# Patient Record
Sex: Male | Born: 1970 | Race: Black or African American | Hispanic: No | Marital: Married | Smoking: Never smoker
Health system: Southern US, Community
[De-identification: ages and names within clinical notes are randomized; demographics above are authoritative.]

---

## 2015-07-16 ENCOUNTER — Encounter: Payer: Self-pay | Admitting: Podiatry

## 2015-07-16 ENCOUNTER — Ambulatory Visit (INDEPENDENT_AMBULATORY_CARE_PROVIDER_SITE_OTHER): Payer: PRIVATE HEALTH INSURANCE | Admitting: Podiatry

## 2015-07-16 ENCOUNTER — Ambulatory Visit (INDEPENDENT_AMBULATORY_CARE_PROVIDER_SITE_OTHER): Payer: PRIVATE HEALTH INSURANCE

## 2015-07-16 VITALS — BP 127/84 | HR 77 | Resp 16

## 2015-07-16 DIAGNOSIS — M722 Plantar fascial fibromatosis: Secondary | ICD-10-CM

## 2015-07-16 NOTE — Progress Notes (Signed)
   Subjective:    Patient ID: Alexander Trujillo, male    DOB: 04/24/1971, 44 y.o.   MRN: [id number]  HPI  Pt presents with pain in left heel, has tried icing, stretching nsaids, pf brace and night splints with no relief  Review of Systems  All other systems reviewed and are negative.      Objective:   Physical Exam        Assessment & Plan:

## 2015-07-18 ENCOUNTER — Ambulatory Visit: Payer: PRIVATE HEALTH INSURANCE | Admitting: Podiatry

## 2015-07-18 NOTE — Progress Notes (Signed)
Subjective:     Patient ID: Alexander Trujillo, male   DOB: 10/07/1970, 44 y.o.   MRN: [id number]  HPI patient points plantar aspect left heel states it's really been bothering him and hurting for about 3 months. Has tried night splints and plantar fascial bracing with no relief   Review of Systems  All other systems reviewed and are negative.      Objective:   Physical Exam  Constitutional: He is oriented to person, place, and time.  Cardiovascular: Intact distal pulses.   Musculoskeletal: Normal range of motion.  Neurological: He is oriented to person, place, and time.  Skin: Skin is warm.  Nursing note and vitals reviewed.  neurovascular status intact muscle strength adequate range of motion within normal limits with patient noted to have exquisite discomfort plantar aspect left heel insertional point tendon the calcaneus with fluid buildup and moderate depression of the arch. Patient's noted to have good digital perfusion and is well oriented 3     Assessment:     Plantar fasciitis left center of the heel    Plan:     Shockwave initiated is patient refuses injection and this will be accomplished over the next few weeks

## 2017-06-16 ENCOUNTER — Ambulatory Visit (INDEPENDENT_AMBULATORY_CARE_PROVIDER_SITE_OTHER): Payer: BLUE CROSS/BLUE SHIELD | Admitting: Physician Assistant

## 2017-06-16 ENCOUNTER — Encounter: Payer: Self-pay | Admitting: Physician Assistant

## 2017-06-16 VITALS — BP 150/110 | HR 88 | Ht 72.0 in | Wt 238.0 lb

## 2017-06-16 DIAGNOSIS — R030 Elevated blood-pressure reading, without diagnosis of hypertension: Secondary | ICD-10-CM | POA: Diagnosis not present

## 2017-06-16 DIAGNOSIS — R1084 Generalized abdominal pain: Secondary | ICD-10-CM | POA: Diagnosis not present

## 2017-06-16 DIAGNOSIS — R1010 Upper abdominal pain, unspecified: Secondary | ICD-10-CM

## 2017-06-16 NOTE — Patient Instructions (Addendum)
140/90   Will call with CT.   DASH Eating Plan DASH stands for "Dietary Approaches to Stop Hypertension." The DASH eating plan is a healthy eating plan that has been shown to reduce high blood pressure (hypertension). It may also reduce your risk for type 2 diabetes, heart disease, and stroke. The DASH eating plan may also help with weight loss. What are tips for following this plan? General guidelines  Avoid eating more than 2,300 mg (milligrams) of salt (sodium) a day. If you have hypertension, you may need to reduce your sodium intake to 1,500 mg a day.  Limit alcohol intake to no more than 1 drink a day for nonpregnant women and 2 drinks a day for men. One drink equals 12 oz of beer, 5 oz of wine, or 1 oz of hard liquor.  Work with your health care provider to maintain a healthy body weight or to lose weight. Ask what an ideal weight is for you.  Get at least 30 minutes of exercise that causes your heart to beat faster (aerobic exercise) most days of the week. Activities may include walking, swimming, or biking.  Work with your health care provider or diet and nutrition specialist (dietitian) to adjust your eating plan to your individual calorie needs. Reading food labels  Check food labels for the amount of sodium per serving. Choose foods with less than 5 percent of the Daily Value of sodium. Generally, foods with less than 300 mg of sodium per serving fit into this eating plan.  To find whole grains, look for the word "whole" as the first word in the ingredient list. Shopping  Buy products labeled as "low-sodium" or "no salt added."  Buy fresh foods. Avoid canned foods and premade or frozen meals. Cooking  Avoid adding salt when cooking. Use salt-free seasonings or herbs instead of table salt or sea salt. Check with your health care provider or pharmacist before using salt substitutes.  Do not fry foods. Cook foods using healthy methods such as baking, boiling, grilling, and  broiling instead.  Cook with heart-healthy oils, such as olive, canola, soybean, or sunflower oil. Meal planning   Eat a balanced diet that includes: ? 5 or more servings of fruits and vegetables each day. At each meal, try to fill half of your plate with fruits and vegetables. ? Up to 6-8 servings of whole grains each day. ? Less than 6 oz of lean meat, poultry, or fish each day. A 3-oz serving of meat is about the same size as a deck of cards. One egg equals 1 oz. ? 2 servings of low-fat dairy each day. ? A serving of nuts, seeds, or beans 5 times each week. ? Heart-healthy fats. Healthy fats called Omega-3 fatty acids are found in foods such as flaxseeds and coldwater fish, like sardines, salmon, and mackerel.  Limit how much you eat of the following: ? Canned or prepackaged foods. ? Food that is high in trans fat, such as fried foods. ? Food that is high in saturated fat, such as fatty meat. ? Sweets, desserts, sugary drinks, and other foods with added sugar. ? Full-fat dairy products.  Do not salt foods before eating.  Try to eat at least 2 vegetarian meals each week.  Eat more home-cooked food and less restaurant, buffet, and fast food.  When eating at a restaurant, ask that your food be prepared with less salt or no salt, if possible. What foods are recommended? The items listed may not be  a complete list. Talk with your dietitian about what dietary choices are best for you. Grains Whole-grain or whole-wheat bread. Whole-grain or whole-wheat pasta. Brown rice. [place]. Bulgur. Whole-grain and low-sodium cereals. Pita bread. Low-fat, low-sodium crackers. Whole-wheat flour tortillas. Vegetables Fresh or frozen vegetables (raw, steamed, roasted, or grilled). Low-sodium or reduced-sodium tomato and vegetable juice. Low-sodium or reduced-sodium tomato sauce and tomato paste. Low-sodium or reduced-sodium canned vegetables. Fruits All fresh, dried, or frozen fruit. Canned  fruit in natural juice (without added sugar). Meat and other protein foods Skinless chicken or [place]. Ground chicken or [place]. Pork with fat trimmed off. Fish and seafood. Egg whites. Dried beans, peas, or lentils. Unsalted nuts, nut butters, and seeds. Unsalted canned beans. Lean cuts of beef with fat trimmed off. Low-sodium, lean deli meat. Dairy Low-fat (1%) or fat-free (skim) milk. Fat-free, low-fat, or reduced-fat cheeses. Nonfat, low-sodium ricotta or cottage cheese. Low-fat or nonfat yogurt. Low-fat, low-sodium cheese. Fats and oils Soft margarine without trans fats. Vegetable oil. Low-fat, reduced-fat, or light mayonnaise and salad dressings (reduced-sodium). Canola, safflower, olive, soybean, and sunflower oils. Avocado. Seasoning and other foods Herbs. Spices. Seasoning mixes without salt. Unsalted popcorn and pretzels. Fat-free sweets. What foods are not recommended? The items listed may not be a complete list. Talk with your dietitian about what dietary choices are best for you. Grains Baked goods made with fat, such as croissants, muffins, or some breads. Dry pasta or rice meal packs. Vegetables Creamed or fried vegetables. Vegetables in a cheese sauce. Regular canned vegetables (not low-sodium or reduced-sodium). Regular canned tomato sauce and paste (not low-sodium or reduced-sodium). Regular tomato and vegetable juice (not low-sodium or reduced-sodium). Angie Fava. Olives. Fruits Canned fruit in a light or heavy syrup. Fried fruit. Fruit in cream or butter sauce. Meat and other protein foods Fatty cuts of meat. Ribs. Fried meat. Berniece Salines. Sausage. Bologna and other processed lunch meats. Salami. Fatback. Hotdogs. Bratwurst. Salted nuts and seeds. Canned beans with added salt. Canned or smoked fish. Whole eggs or egg yolks. Chicken or [place] with skin. Dairy Whole or 2% milk, cream, and half-and-half. Whole or full-fat cream cheese. Whole-fat or sweetened yogurt. Full-fat cheese.  Nondairy creamers. Whipped toppings. Processed cheese and cheese spreads. Fats and oils Butter. Stick margarine. Lard. Shortening. Ghee. Bacon fat. Tropical oils, such as coconut, palm kernel, or palm oil. Seasoning and other foods Salted popcorn and pretzels. Onion salt, garlic salt, seasoned salt, table salt, and sea salt. Worcestershire sauce. Tartar sauce. Barbecue sauce. Teriyaki sauce. Soy sauce, including reduced-sodium. Steak sauce. Canned and packaged gravies. Fish sauce. Oyster sauce. Cocktail sauce. Horseradish that you find on the shelf. Ketchup. Mustard. Meat flavorings and tenderizers. Bouillon cubes. Hot sauce and Tabasco sauce. Premade or packaged marinades. Premade or packaged taco seasonings. Relishes. Regular salad dressings. Where to find more information:  National Heart, Lung, and [place]: https://wilson-eaton.com/  American Heart Association: www.heart.org Summary  The DASH eating plan is a healthy eating plan that has been shown to reduce high blood pressure (hypertension). It may also reduce your risk for type 2 diabetes, heart disease, and stroke.  With the DASH eating plan, you should limit salt (sodium) intake to 2,300 mg a day. If you have hypertension, you may need to reduce your sodium intake to 1,500 mg a day.  When on the DASH eating plan, aim to eat more fresh fruits and vegetables, whole grains, lean proteins, low-fat dairy, and heart-healthy fats.  Work with your health care provider or diet and nutrition specialist (  dietitian) to adjust your eating plan to your individual calorie needs. This information is not intended to replace advice given to you by your health care provider. Make sure you discuss any questions you have with your health care provider. Document Released: 08/28/2011 Document Revised: 09/01/2016 Document Reviewed: 09/01/2016 Elsevier Interactive Patient Education  2017 [employer].

## 2017-06-16 NOTE — Progress Notes (Signed)
   Subjective:    Patient ID: Alexander Trujillo, male    DOB: 09-02-1971, 46 y.o.   MRN: [id number]  HPI  Pt is a 46 yo male who presents to the clinic to establish care.   .. Active Ambulatory Problems    Diagnosis Date Noted  . Elevated blood pressure reading 06/16/2017  . Abdominal pain, generalized 06/16/2017   Resolved Ambulatory Problems    Diagnosis Date Noted  . No Resolved Ambulatory Problems   No Additional Past Medical History   .Marland Kitchen Family History  Problem Relation Age of Onset  . Hypertension Maternal Grandmother   . Cancer Maternal Grandfather        testicular   Pt comes in to discuss episodes of abdominal pain. First happened a couple of months ago while doing a sit up at the gym. He felt like he got a muscle spasm "charley horse" in his abdomen. Took about 20 minutes to resolve on it's on. His abdomen felt sore for a few days. 1 month ago he did same thing but tenderness has continued. Now he has ongoing abdomen soreness. Denies any n/v/d. Bowels have not changed. Pain is not based around food. Not tried anything to make better.    Review of Systems  All other systems reviewed and are negative.      Objective:   Physical Exam  Constitutional: He is oriented to person, place, and time. He appears well-developed and well-nourished.  HENT:  Head: Normocephalic and atraumatic.  Cardiovascular: Normal rate, regular rhythm and normal heart sounds.   Pulmonary/Chest: Effort normal and breath sounds normal.  Abdominal: Soft. Bowel sounds are normal. He exhibits no distension and no mass. There is tenderness. There is no rebound and no guarding.  Mild tenderness to palpation in epigastric area.  Negative murphys sign.  When asked to lift head while laying down seemed to have a mild bulge in the upper abdomen.   Neurological: He is alert and oriented to person, place, and time.  Psychiatric: He has a normal mood and affect. His behavior is normal.           Assessment & Plan:  Marland KitchenMarland KitchenHamlin was seen today for establish care and abdominal pain.  Diagnoses and all orders for this visit:  Abdominal pain, generalized -     COMPLETE METABOLIC PANEL WITH GFR -     Magnesium  Elevated blood pressure reading  Pain of upper abdomen -     US Abdomen Complete; Future   Suspect muscle cramps with or without hernia. Will get u/s.  Will get labs.   BP elevated discussed DASH diet. Recheck BP in 2 weeks.

## 2017-06-19 ENCOUNTER — Ambulatory Visit: Payer: BLUE CROSS/BLUE SHIELD

## 2017-06-26 ENCOUNTER — Other Ambulatory Visit: Payer: BLUE CROSS/BLUE SHIELD

## 2017-06-29 ENCOUNTER — Telehealth: Payer: Self-pay | Admitting: Physician Assistant

## 2017-06-29 DIAGNOSIS — R1010 Upper abdominal pain, unspecified: Secondary | ICD-10-CM

## 2017-06-29 DIAGNOSIS — R1011 Right upper quadrant pain: Secondary | ICD-10-CM

## 2017-06-29 NOTE — Telephone Encounter (Signed)
Per radiology order should be for CT instead of [place]. Routing for order.

## 2017-06-30 ENCOUNTER — Ambulatory Visit: Payer: BLUE CROSS/BLUE SHIELD | Admitting: Physician Assistant

## 2017-06-30 ENCOUNTER — Encounter: Payer: Self-pay | Admitting: Physician Assistant

## 2017-06-30 ENCOUNTER — Ambulatory Visit (INDEPENDENT_AMBULATORY_CARE_PROVIDER_SITE_OTHER): Payer: BLUE CROSS/BLUE SHIELD | Admitting: Physician Assistant

## 2017-06-30 VITALS — BP 138/80 | HR 89 | Ht 72.0 in | Wt 227.0 lb

## 2017-06-30 DIAGNOSIS — R030 Elevated blood-pressure reading, without diagnosis of hypertension: Secondary | ICD-10-CM

## 2017-06-30 DIAGNOSIS — I10 Essential (primary) hypertension: Secondary | ICD-10-CM | POA: Diagnosis not present

## 2017-06-30 DIAGNOSIS — R1084 Generalized abdominal pain: Secondary | ICD-10-CM

## 2017-06-30 MED ORDER — CHLORTHALIDONE 25 MG PO TABS: 12.5000 mg | ORAL_TABLET | Freq: Every day | ORAL | 5 refills | Status: DC

## 2017-06-30 NOTE — Telephone Encounter (Signed)
Ok to order CT of abdomen/pelvis w contrast. I tried to order Alexander Trujillo and then system alerted me to try u/s first so I thought it might be more affordable.

## 2017-06-30 NOTE — Progress Notes (Addendum)
   Subjective:    Patient ID: Alexander Trujillo, male    DOB: Feb 11, 1971, 46 y.o.   MRN: [id number]  HPI The patient is a pleasant 46 year old male who presents today for a two week BP check. He was seen on 9/25 and his BP reading was 150/110. He states that he has been checking his BP at home and it often ranges from 128-134/80-90. He has starting working out Monday through Friday daily. He is walking on the treadmill for cardio, but he states that he has avoided lifting or abdominal exercises given his upper abdominal pain he has experienced recently.  He has also limited his sodium intake. He is taking magnesium and garlic supplements daily for the last week. He also mentioned wanting to discuss use of a diuretic because he feels like he is retaining water.   Denies vision changes, headaches, bowel changes.   .. Active Ambulatory Problems    Diagnosis Date Noted  . Elevated blood pressure reading 06/16/2017  . Abdominal pain, generalized 06/16/2017   Resolved Ambulatory Problems    Diagnosis Date Noted  . No Resolved Ambulatory Problems   No Additional Past Medical History   Review of Systems  All other systems reviewed and are negative.      Objective:   Physical Exam  Constitutional: He is oriented to person, place, and time. He appears well-developed and well-nourished.  HENT:  Head: Normocephalic and atraumatic.  Cardiovascular: Normal rate, regular rhythm and normal heart sounds.   Pulmonary/Chest: Effort normal and breath sounds normal.  Neurological: He is alert and oriented to person, place, and time.  Skin: Skin is warm and dry.  Psychiatric: He has a normal mood and affect. His behavior is normal. Judgment and thought content normal.      Assessment & Plan:  Marland KitchenMarland KitchenDiagnoses and all orders for this visit:  Hypertension, unspecified type -     chlorthalidone (HYGROTON) 25 MG tablet; Take 0.5 tablets (12.5 mg total) by mouth daily.  Elevated blood pressure  reading  Abdominal pain, generalized  He is extremely motivated to control his elevated blood pressure through exercise and diet. Given his borderline BP readings, we will start chlorthalidone. He is going to continue checking his BP at home daily. Follow up in 6 months. Discussed side effects of medication. If he were to have muscle cramps or weakness please follow up.   Pt is to schedule his CT of abdomen to rule out hernia due to abdominal pain today.

## 2017-06-30 NOTE — Telephone Encounter (Signed)
CT ordered. 

## 2017-07-03 ENCOUNTER — Ambulatory Visit (INDEPENDENT_AMBULATORY_CARE_PROVIDER_SITE_OTHER): Payer: BLUE CROSS/BLUE SHIELD

## 2017-07-03 DIAGNOSIS — R1010 Upper abdominal pain, unspecified: Secondary | ICD-10-CM

## 2017-07-03 DIAGNOSIS — R109 Unspecified abdominal pain: Secondary | ICD-10-CM | POA: Diagnosis not present

## 2017-07-03 DIAGNOSIS — R1011 Right upper quadrant pain: Secondary | ICD-10-CM | POA: Diagnosis not present

## 2017-07-03 DIAGNOSIS — K429 Umbilical hernia without obstruction or gangrene: Secondary | ICD-10-CM | POA: Diagnosis not present

## 2017-07-03 IMAGING — CT CT ABD-PELV W/ CM
2 of 5 series · 16 of 46 positions shown, 18 images · IV contrast (APPLIED)
Comparison: None.

CLINICAL DATA: Right-sided abdominal pain.

EXAM:
CT ABDOMEN AND PELVIS WITH CONTRAST
TECHNIQUE: Multidetector CT imaging of the abdomen and pelvis was performed
using the standard protocol following bolus administration of
intravenous contrast.
CONTRAST:  100mL [47] IOPAMIDOL ([47]) INJECTION 61%

[Series 2: axial st · axial · 0.83mm/px · z∈[-605,-105]mm · 13 of 112 slices shown, 15 images]
[im 6/112  soft-tissue]
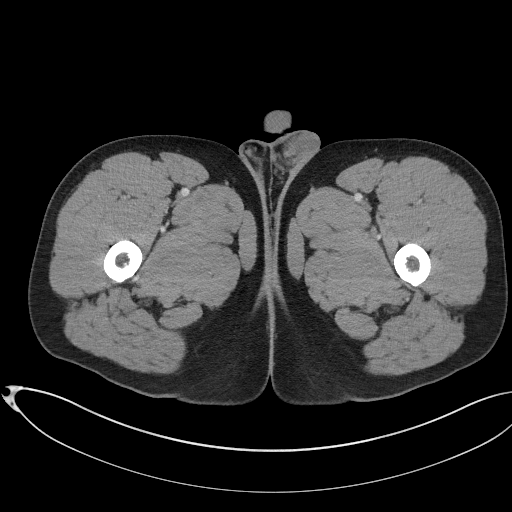
[im 6/112  bone]
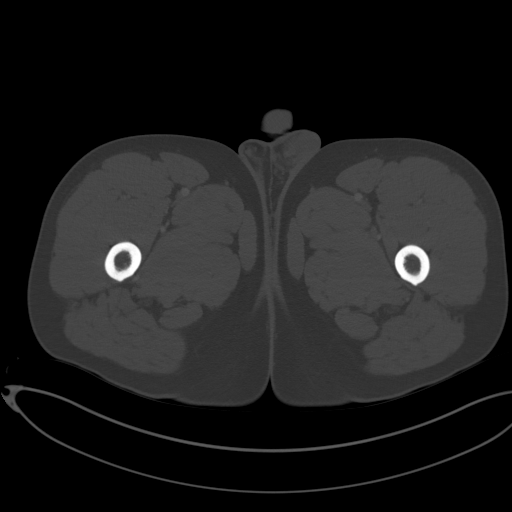
[im 17/112  soft-tissue]
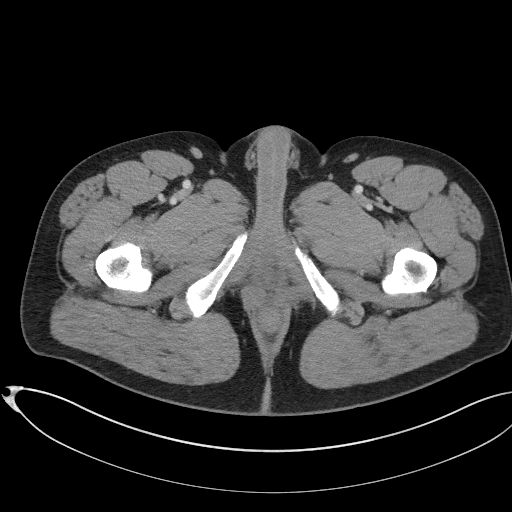
[im 23/112  soft-tissue]
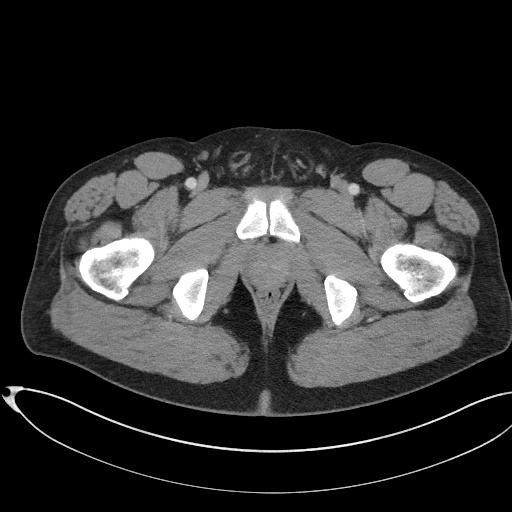
[im 34/112  soft-tissue]
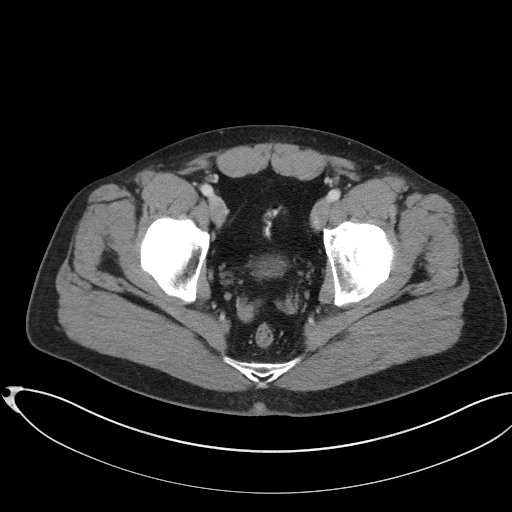
[im 39/112  soft-tissue]
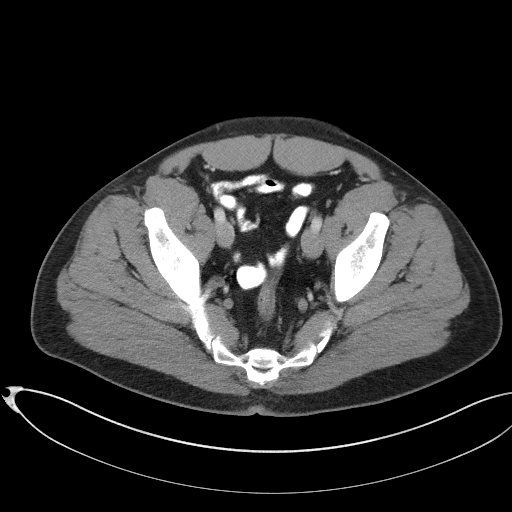
[im 50/112  soft-tissue]
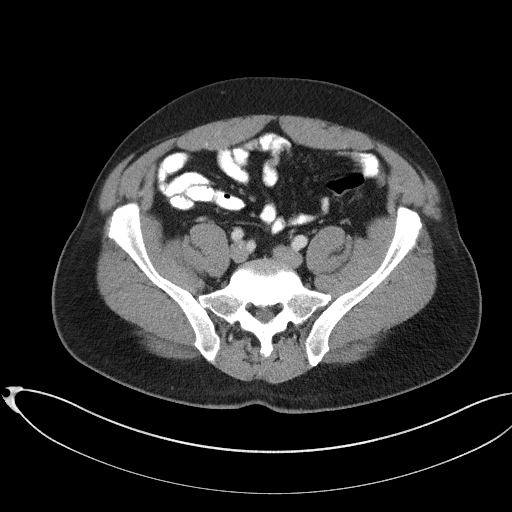
[im 56/112  soft-tissue]
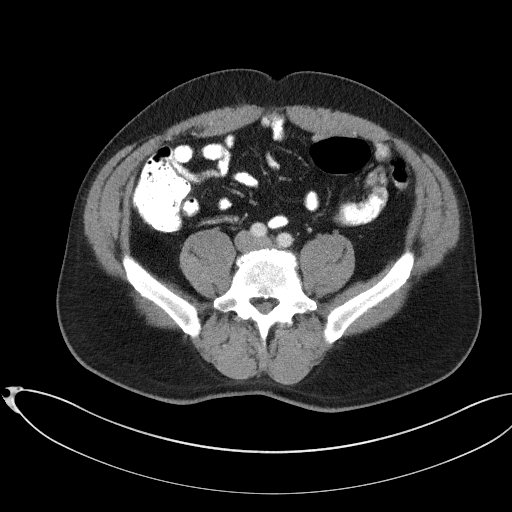
[im 62/112  soft-tissue]
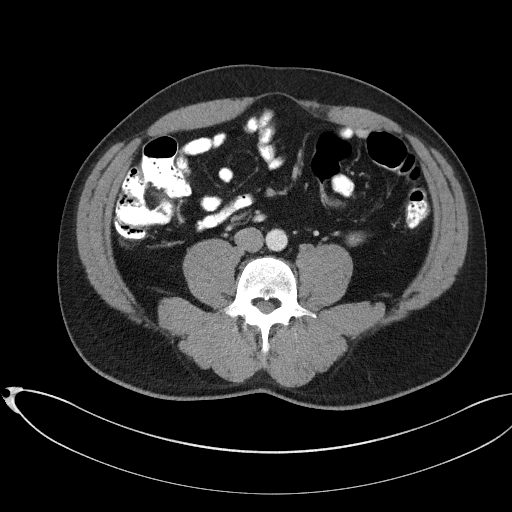
[im 73/112  soft-tissue]
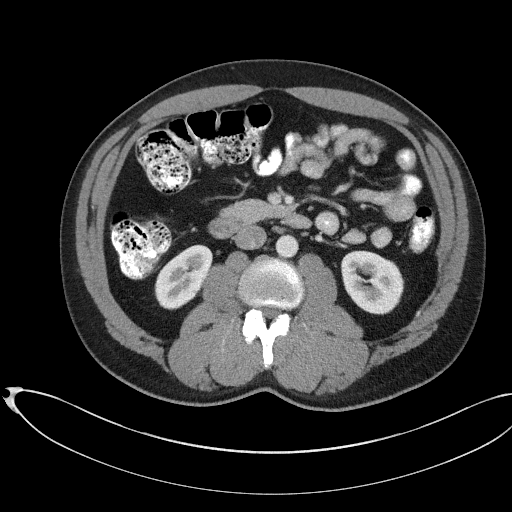
[im 73/112  bone]
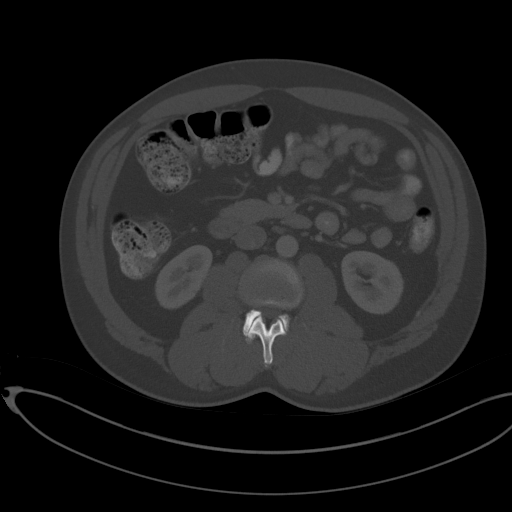
[im 78/112  soft-tissue]
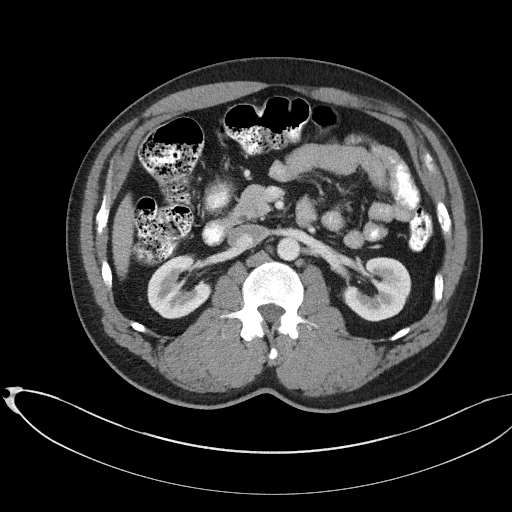
[im 89/112  soft-tissue]
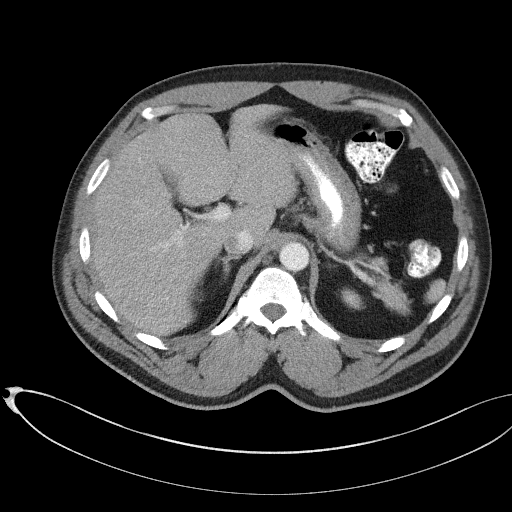
[im 95/112  soft-tissue]
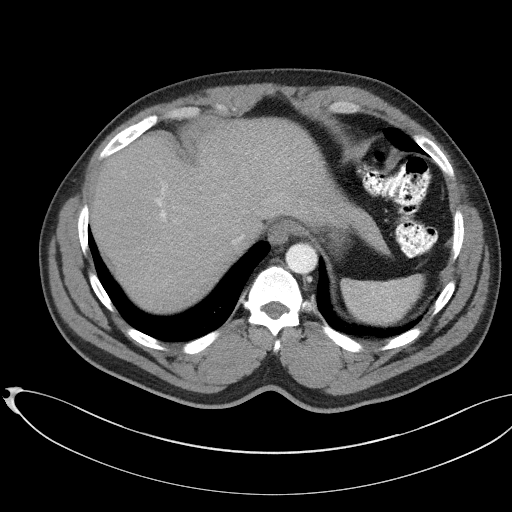
[im 106/112  soft-tissue]
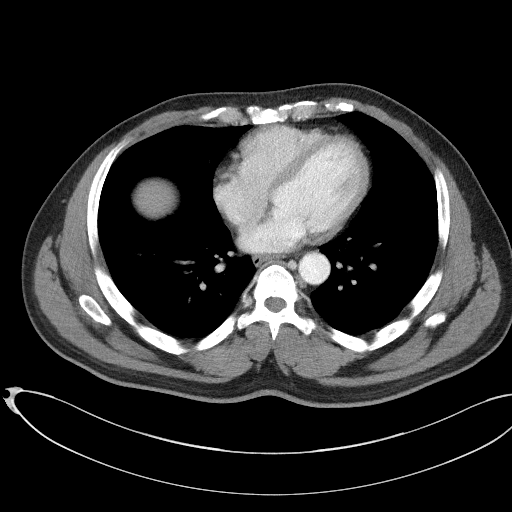

[Series 4: coronal st · coronal · 1.05mm/px · 3 of 97 slices shown]
[im 33/97  soft-tissue]
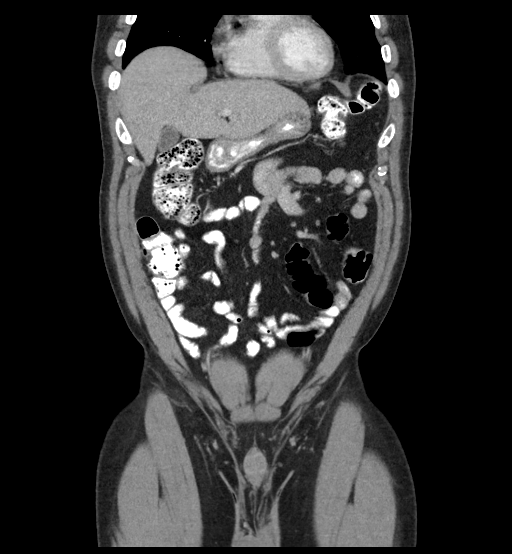
[im 43/97  soft-tissue]
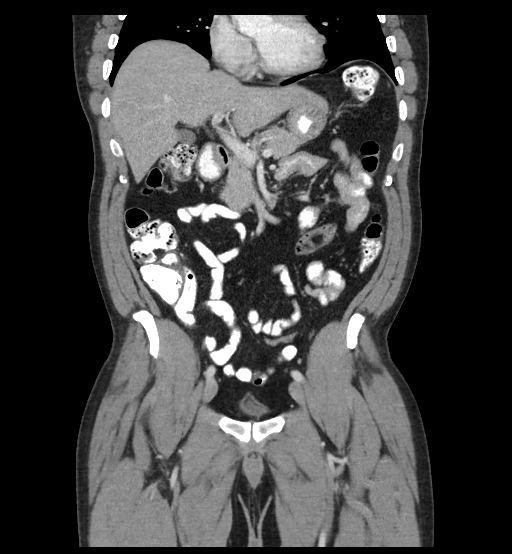
[im 54/97  soft-tissue]
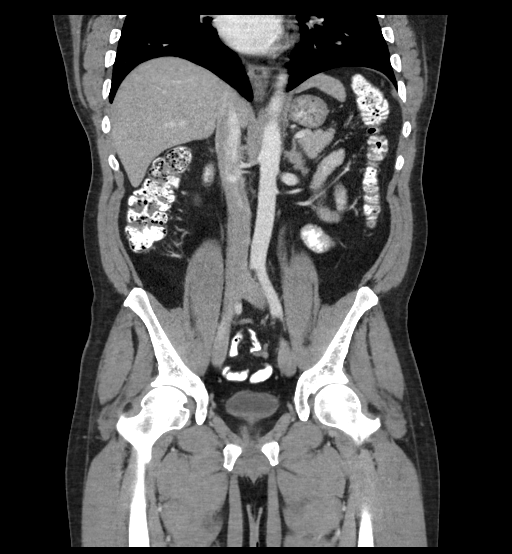

[16 of 46 positions shown; findings below may reference images not displayed]

FINDINGS: Lower chest: The lung bases are clear of acute process. No pleural
effusion or pulmonary lesions. The heart is normal in size. No
pericardial effusion. The distal esophagus and aorta are
unremarkable.

Hepatobiliary: Tiny scattered low-attenuation lesions are likely
benign cysts. Of no worrisome lesions or intrahepatic biliary
dilatation. Gallbladder is normal. No common bile duct dilatation.

Pancreas: No mass, inflammation or ductal dilatation.

Spleen: Normal size.  No focal lesions.

Adrenals/Urinary Tract: The adrenal glands and kidneys are
unremarkable. Small midpole right renal cyst. No ureteral or bladder
calculi. No renal or bladder mass.

Stomach/Bowel: The stomach, duodenum, small bowel and colon are
unremarkable. No acute inflammatory process, mass lesions or
obstructive findings. The terminal ileum is normal. The appendix is
normal.

Vascular/Lymphatic: The aorta is normal in caliber. No dissection.
The branch vessels are patent. The major venous structures are
patent. No mesenteric or retroperitoneal mass or adenopathy. Small
scattered lymph nodes are noted.

Reproductive: The prostate gland and seminal vesicles are
unremarkable.

Other: No pelvic mass or adenopathy. No free pelvic fluid
collections. No inguinal mass or adenopathy. No inguinal hernia.
Small umbilical hernia containing fat.

Musculoskeletal: No significant bony findings. Mild SI joint
degenerative changes are noted. The rectus and oblique abdominal
muscles appear normal.
IMPRESSION: No acute abdominal/ pelvic findings, mass lesions or adenopathy.

Small umbilical hernia containing fat.

## 2017-07-03 MED ORDER — IOPAMIDOL (ISOVUE-300) INJECTION 61%
100.0000 mL | Freq: Once | INTRAVENOUS | Status: AC | PRN
  Administered 2017-07-03: 100 mL via INTRAVENOUS

## 2017-07-04 ENCOUNTER — Encounter: Payer: Self-pay | Admitting: Physician Assistant

## 2017-07-04 DIAGNOSIS — K429 Umbilical hernia without obstruction or gangrene: Secondary | ICD-10-CM | POA: Insufficient documentation

## 2017-07-08 ENCOUNTER — Telehealth: Payer: Self-pay | Admitting: Podiatry

## 2017-07-08 NOTE — Telephone Encounter (Signed)
error 

## 2017-07-10 DIAGNOSIS — R1084 Generalized abdominal pain: Secondary | ICD-10-CM | POA: Diagnosis not present

## 2017-07-11 LAB — COMPLETE METABOLIC PANEL WITH GFR
AG Ratio: 1.5 (calc) (ref 1.0–2.5)
ALT: 19 U/L (ref 9–46)
AST: 22 U/L (ref 10–40)
Albumin: 4.8 g/dL (ref 3.6–5.1)
Alkaline phosphatase (APISO): 57 U/L (ref 40–115)
BUN: 13 mg/dL (ref 7–25)
CO2: 31 mmol/L (ref 20–32)
Calcium: 10.1 mg/dL (ref 8.6–10.3)
Chloride: 99 mmol/L (ref 98–110)
Creat: 1.17 mg/dL (ref 0.60–1.35)
GFR, Est African American: 85 mL/min/{1.73_m2} (ref >=60)
GFR, Est Non African American: 73 mL/min/{1.73_m2} (ref >=60)
Globulin: 3.3 g/dL (calc) (ref 1.9–3.7)
Glucose, Bld: 82 mg/dL (ref 65–99)
Potassium: 3.8 mmol/L (ref 3.5–5.3)
Sodium: 139 mmol/L (ref 135–146)
Total Bilirubin: 0.6 mg/dL (ref 0.2–1.2)
Total Protein: 8.1 g/dL (ref 6.1–8.1)

## 2017-07-11 LAB — MAGNESIUM: Magnesium: 2.2 mg/dL (ref 1.5–2.5)

## 2020-03-23 ENCOUNTER — Other Ambulatory Visit: Payer: Self-pay

## 2020-03-23 ENCOUNTER — Ambulatory Visit (INDEPENDENT_AMBULATORY_CARE_PROVIDER_SITE_OTHER): Payer: BC Managed Care – PPO | Admitting: Physician Assistant

## 2020-03-23 ENCOUNTER — Encounter: Payer: Self-pay | Admitting: Physician Assistant

## 2020-03-23 VITALS — Ht 72.0 in | Wt 232.0 lb

## 2020-03-23 DIAGNOSIS — R030 Elevated blood-pressure reading, without diagnosis of hypertension: Secondary | ICD-10-CM | POA: Diagnosis not present

## 2020-03-23 DIAGNOSIS — R42 Dizziness and giddiness: Secondary | ICD-10-CM

## 2020-03-23 DIAGNOSIS — Z1159 Encounter for screening for other viral diseases: Secondary | ICD-10-CM | POA: Diagnosis not present

## 2020-03-23 DIAGNOSIS — H6123 Impacted cerumen, bilateral: Secondary | ICD-10-CM | POA: Insufficient documentation

## 2020-03-23 DIAGNOSIS — Z13220 Encounter for screening for lipoid disorders: Secondary | ICD-10-CM | POA: Diagnosis not present

## 2020-03-23 DIAGNOSIS — Z131 Encounter for screening for diabetes mellitus: Secondary | ICD-10-CM | POA: Diagnosis not present

## 2020-03-23 MED ORDER — CHLORTHALIDONE 25 MG PO TABS: 12.5000 mg | ORAL_TABLET | Freq: Every day | ORAL | 5 refills | Status: DC

## 2020-03-23 MED ORDER — DEBROX 6.5 % OT SOLN: 10.0000 [drp] | Freq: Two times a day (BID) | OTIC | 0 refills | Status: DC

## 2020-03-23 NOTE — Progress Notes (Signed)
Subjective:    Patient ID: Alexander Trujillo, male    DOB: [birth year], 49 y.o.   MRN: [id number]  HPI  Patient is a 49 year old male with high blood pressure problems in the past who presents to the clinic with dizziness for the last 2 weeks.  Patient has not been taking his chlorthalidone for his blood pressure.  He has not been checking his blood pressure.  He denies any chest pains, palpitations, headaches, vision changes.  For the past 2 weeks he has noticed intermittent dizziness.  Does not always happen with position changes but it can happen with position changes.  He can just be laying in bed and just feel a little dizzy.  He denies any headaches.  He has done nothing to make it better.  He has been out of his medication for blood pressure for a while.  He does wear wireless Bluetooth earplugs regularly.  He denies any changes in altitude or recent travel.  He denies any nausea or vomiting.  He denies any lower extremity edema.  His dizziness episodes do not seem to be related to eating or drinking.  He denies any ear pain, sinus pressure, congestion.  .. Active Ambulatory Problems    Diagnosis Date Noted  . Pre-hypertension 06/16/2017  . Abdominal pain, generalized 06/16/2017  . Umbilical hernia 07/04/2017  . Dizziness 03/23/2020  . Bilateral impacted cerumen 03/23/2020   Resolved Ambulatory Problems    Diagnosis Date Noted  . No Resolved Ambulatory Problems   No Additional Past Medical History     Review of Systems See HPI.     Objective:   Physical Exam Vitals reviewed.  Constitutional:      Appearance: Normal appearance.  HENT:     Head: Normocephalic.     Right Ear: There is impacted cerumen.     Left Ear: There is impacted cerumen.     Nose: Nose normal.  Eyes:     Extraocular Movements: Extraocular movements intact.     Conjunctiva/sclera: Conjunctivae normal.     Pupils: Pupils are equal, round, and reactive to light.  Cardiovascular:     Rate and Rhythm:  Normal rate and regular rhythm.     Pulses: Normal pulses.  Pulmonary:     Effort: Pulmonary effort is normal.     Breath sounds: Normal breath sounds.  Musculoskeletal:     Right lower leg: No edema.     Left lower leg: No edema.  Neurological:     General: No focal deficit present.     Mental Status: He is alert.     Comments: Negative dix hallpike.   Psychiatric:        Mood and Affect: Mood normal.        Behavior: Behavior normal.           Assessment & Plan:  Marland KitchenMarland KitchenJequan was seen today for dizziness.  Diagnoses and all orders for this visit:  Dizziness -     Hepatitis C Antibody -     COMPLETE METABOLIC PANEL WITH GFR -     Lipid Panel w/reflex Direct LDL -     TSH -     CBC with Differential/Platelet  Pre-hypertension -     chlorthalidone (HYGROTON) 25 MG tablet; Take 0.5 tablets (12.5 mg total) by mouth daily.  Encounter for hepatitis C screening test for low risk patient -     Hepatitis C Antibody  Screening for diabetes mellitus -     COMPLETE METABOLIC PANEL WITH  GFR  Screening for lipid disorders -     Lipid Panel w/reflex Direct LDL  Bilateral impacted cerumen -     carbamide peroxide (DEBROX) 6.5 % OTIC solution; Place 10 drops into the left ear 2 (two) times daily. Dispense 1 bottle    Orthostatic VS for the past 24 hrs (Last 3 readings):  BP- Lying Pulse- Lying BP- Sitting Pulse- Sitting BP- Standing at 0 minutes Pulse- Standing at 0 minutes  03/23/20 0827 138/76 66 128/89 70 122/83 75   Orthostatics normal. Dix hallpike negative.  Could be the wax in ears causing some of this.  Make sure staying hydrated.  Get labs.  Restarted chlorthalidone.  Follow up in 4 weeks.    Marland Kitchen.Cerumen Removal Template: Indication: Cerumen impaction of the ear(s) Medical necessity statement: On physical examination, cerumen impairs clinically significant portions of the external auditory canal, and tympanic membrane. Noted obstructive, copious cerumen that cannot  be removed without magnification and instrumentations requiring physician skills Consent: Discussed benefits and risks of procedure and verbal consent obtained Procedure: Patient was prepped for the procedure. Utilized an otoscope to assess and take note of the ear canal, the tympanic membrane, and the presence, amount, and placement of the cerumen. Gentle water irrigation and soft plastic curette was utilized to remove cerumen.  Post procedure examination shows cerumen was completely removed. Patient tolerated procedure well. The patient is made aware that they may experience temporary vertigo, temporary hearing loss, and temporary discomfort. If these symptom last for more than 24 hours to call the clinic or proceed to the ED.  Use debrox and follow up in 4 weeks. Will try to get the rest out.

## 2020-03-23 NOTE — Patient Instructions (Addendum)
Get labs.  Lets see if cleaning your ears out and restarting blood pressure medication could help.  Keep low salt diet and stay hydrated.

## 2020-03-27 LAB — COMPLETE METABOLIC PANEL WITH GFR
AG Ratio: 1.3 (calc) (ref 1.0–2.5)
ALT: 16 U/L (ref 9–46)
AST: 15 U/L (ref 10–35)
Albumin: 4.3 g/dL (ref 3.6–5.1)
Alkaline phosphatase (APISO): 58 U/L (ref 35–144)
BUN: 13 mg/dL (ref 7–25)
CO2: 31 mmol/L (ref 20–32)
Calcium: 9.9 mg/dL (ref 8.6–10.3)
Chloride: 103 mmol/L (ref 98–110)
Creat: 1.04 mg/dL (ref 0.70–1.33)
GFR, Est African American: 96 mL/min/{1.73_m2} (ref >=60)
GFR, Est Non African American: 83 mL/min/{1.73_m2} (ref >=60)
Globulin: 3.2 g/dL (calc) (ref 1.9–3.7)
Glucose, Bld: 85 mg/dL (ref 65–99)
Potassium: 4.7 mmol/L (ref 3.5–5.3)
Sodium: 139 mmol/L (ref 135–146)
Total Bilirubin: 0.4 mg/dL (ref 0.2–1.2)
Total Protein: 7.5 g/dL (ref 6.1–8.1)

## 2020-03-27 LAB — LIPID PANEL W/REFLEX DIRECT LDL
Cholesterol: 178 mg/dL (ref <200)
HDL: 36 mg/dL — ABNORMAL LOW (ref >=40)
LDL Cholesterol (Calc): 118 mg/dL (calc) — ABNORMAL HIGH
Non-HDL Cholesterol (Calc): 142 mg/dL (calc) — ABNORMAL HIGH (ref <130)
Total CHOL/HDL Ratio: 4.9 (calc) (ref <5.0)
Triglycerides: 126 mg/dL (ref <150)

## 2020-03-27 LAB — CBC WITH DIFFERENTIAL/PLATELET
Absolute Monocytes: 322 cells/uL (ref 200–950)
Basophils Absolute: 60 cells/uL (ref 0–200)
Basophils Relative: 1.3 %
Eosinophils Absolute: 221 cells/uL (ref 15–500)
Eosinophils Relative: 4.8 %
HCT: 43.3 % (ref 38.5–50.0)
Hemoglobin: 14.5 g/dL (ref 13.2–17.1)
Lymphs Abs: 1205 cells/uL (ref 850–3900)
MCH: 29.4 pg (ref 27.0–33.0)
MCHC: 33.5 g/dL (ref 32.0–36.0)
MCV: 87.8 fL (ref 80.0–100.0)
MPV: 11.3 fL (ref 7.5–12.5)
Monocytes Relative: 7 %
Neutro Abs: 2792 cells/uL (ref 1500–7800)
Neutrophils Relative %: 60.7 %
Platelets: 207 10*3/uL (ref 140–400)
RBC: 4.93 10*6/uL (ref 4.20–5.80)
RDW: 12.5 % (ref 11.0–15.0)
Total Lymphocyte: 26.2 %
WBC: 4.6 10*3/uL (ref 3.8–10.8)

## 2020-03-27 LAB — HEPATITIS C ANTIBODY
Hepatitis C Ab: NONREACTIVE
SIGNAL TO CUT-OFF: 0.01 (ref <1.00)

## 2020-03-27 LAB — TSH: TSH: 0.63 mIU/L (ref 0.40–4.50)

## 2020-03-27 NOTE — Progress Notes (Signed)
Alexander Trujillo,   WBC looks great.  No anemia.  Thyroid in the normal range.  Kidney, liver, glucose look great.  Cholesterol looks good.  HDL could go up some with diet and exercise.  LDL is just slightly above optimal but still looks good with no other risk factors.

## 2020-03-28 NOTE — Progress Notes (Signed)
Hep C screening negative

## 2020-04-25 ENCOUNTER — Encounter: Payer: Self-pay | Admitting: Physician Assistant

## 2020-04-25 ENCOUNTER — Ambulatory Visit (INDEPENDENT_AMBULATORY_CARE_PROVIDER_SITE_OTHER): Payer: BC Managed Care – PPO | Admitting: Physician Assistant

## 2020-04-25 VITALS — BP 119/66 | HR 81 | Wt 229.0 lb

## 2020-04-25 DIAGNOSIS — R42 Dizziness and giddiness: Secondary | ICD-10-CM | POA: Diagnosis not present

## 2020-04-25 DIAGNOSIS — H6122 Impacted cerumen, left ear: Secondary | ICD-10-CM

## 2020-04-25 NOTE — Progress Notes (Signed)
Subjective:    Patient ID: Alexander Trujillo, male    DOB: 1971-02-24, 49 y.o.   MRN: [id number]  HPI  Pt is a 49 yo male with HTN who presents to the clinic to follow up on dizziness. This started a little over a month ago but has not resolved. He was orginally off HCTZ for BP and thought that could be it. BP controlled today. No vision changes, headaches, vomiting or nausea. No weakness of extremities.  He can notice it when he lays down at night or when he is getting up from his knees. Most of the time the episodes last about 2 minutes. He does not always feel like "room is spinning" both closing his eyes sometimes does help. He has not passed out. No ear pain or sinus pressure. He did have bilateral ear impaction at first visit for dizziness but not able to get all the impaction out. Has been using debrox.   .. Active Ambulatory Problems    Diagnosis Date Noted  . Pre-hypertension 06/16/2017  . Abdominal pain, generalized 06/16/2017  . Umbilical hernia 07/04/2017  . Dizziness 03/23/2020  . Bilateral impacted cerumen 03/23/2020   Resolved Ambulatory Problems    Diagnosis Date Noted  . No Resolved Ambulatory Problems   No Additional Past Medical History      Review of Systems  All other systems reviewed and are negative.      Objective:   Physical Exam Vitals reviewed.  Constitutional:      Appearance: Normal appearance.  HENT:     Head: Normocephalic.     Right Ear: Tympanic membrane, ear canal and external ear normal. There is no impacted cerumen.     Left Ear: There is impacted cerumen.     Nose: Nose normal.     Mouth/Throat:     Mouth: Mucous membranes are moist.  Eyes:     Extraocular Movements: Extraocular movements intact.     Pupils: Pupils are equal, round, and reactive to light.  Cardiovascular:     Rate and Rhythm: Normal rate and regular rhythm.     Pulses: Normal pulses.  Pulmonary:     Effort: Pulmonary effort is normal.     Breath sounds: Normal breath  sounds.  Neurological:     General: No focal deficit present.     Mental Status: He is alert and oriented to person, place, and time.  Psychiatric:        Mood and Affect: Mood normal.           Assessment & Plan:  Marland KitchenMarland KitchenNicolai was seen today for dizziness.  Diagnoses and all orders for this visit:  Dizziness  Left ear impacted cerumen   .Marland KitchenCerumen Removal Template: Indication: Cerumen impaction of the ear(s) Medical necessity statement: On physical examination, cerumen impairs clinically significant portions of the external auditory canal, and tympanic membrane. Noted obstructive, copious cerumen that cannot be removed without magnification and instrumentations requiring physician skills Consent: Discussed benefits and risks of procedure and verbal consent obtained Procedure: Patient was prepped for the procedure. Utilized an otoscope to assess and take note of the ear canal, the tympanic membrane, and the presence, amount, and placement of the cerumen. Gentle water irrigation and soft plastic curette was utilized to remove cerumen.  Post procedure examination shows cerumen was completely removed. Patient tolerated procedure well. The patient is made aware that they may experience temporary vertigo, temporary hearing loss, and temporary discomfort. If these symptom last for more than 24 hours to  call the clinic or proceed to the ED.  We were able to irrigate left ear today. I hope this will take care of dizziness. Does not appear to be orthostatic hypotension. Negative dix hallpike but could be vertigo.  It has some characteristics of dysautomonia. I want pt to try a few things.  Try epley manuvers for 2 weeks.  Increase salt intake and see if correlates to feeling less dizzy.  Use flonase.  Call back in 2 weeks to consider MRI for persistent dizziness.

## 2020-04-25 NOTE — Patient Instructions (Signed)
Dysautonomia vs Vertigo  Epley manuevers do 3 times a day for 2 weeks.   Increase salt intake and see if make you feel any less dizzy.   If dizzy continues then call office and will order MRI to fully evaluate.

## 2020-05-02 ENCOUNTER — Encounter: Payer: Self-pay | Admitting: Physician Assistant

## 2020-05-02 DIAGNOSIS — H6122 Impacted cerumen, left ear: Secondary | ICD-10-CM | POA: Insufficient documentation

## 2020-05-10 ENCOUNTER — Telehealth: Payer: Self-pay

## 2020-05-10 DIAGNOSIS — R42 Dizziness and giddiness: Secondary | ICD-10-CM

## 2020-05-10 NOTE — Telephone Encounter (Signed)
Patient called stating that he is still having dizziness. Wanting to have MRI ordered

## 2020-05-14 NOTE — Telephone Encounter (Signed)
ordered

## 2020-05-15 ENCOUNTER — Ambulatory Visit (INDEPENDENT_AMBULATORY_CARE_PROVIDER_SITE_OTHER): Payer: BC Managed Care – PPO

## 2020-05-15 ENCOUNTER — Other Ambulatory Visit: Payer: Self-pay | Admitting: Physician Assistant

## 2020-05-15 ENCOUNTER — Other Ambulatory Visit: Payer: Self-pay

## 2020-05-15 DIAGNOSIS — Z01818 Encounter for other preprocedural examination: Secondary | ICD-10-CM | POA: Diagnosis not present

## 2020-05-15 DIAGNOSIS — Z135 Encounter for screening for eye and ear disorders: Secondary | ICD-10-CM | POA: Diagnosis not present

## 2020-05-15 DIAGNOSIS — T1590XA Foreign body on external eye, part unspecified, unspecified eye, initial encounter: Secondary | ICD-10-CM

## 2020-05-15 DIAGNOSIS — R42 Dizziness and giddiness: Secondary | ICD-10-CM | POA: Diagnosis not present

## 2020-05-15 IMAGING — MR MR HEAD W/O CM
10 series · 48 of 48 positions shown · non-contrast
Comparison: None.

CLINICAL DATA: Dizziness for 2 months

EXAM:
MRI HEAD WITHOUT CONTRAST
TECHNIQUE: Multiplanar, multiecho pulse sequences of the brain and surrounding
structures were obtained without intravenous contrast.

[Series 3: DWI · axial · 3.0mm · 1.20mm/px · z∈[-53,+109]mm · 6 of 55 slices shown (1 of 2)]
[im 1/55]
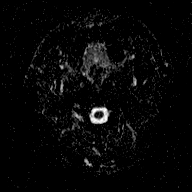
[im 11/55]
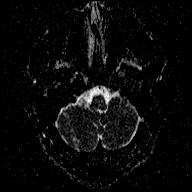
[im 22/55]
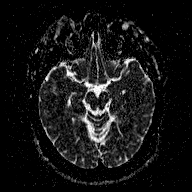
[im 33/55]
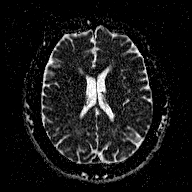
[im 44/55]
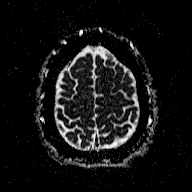
[im 55/55]
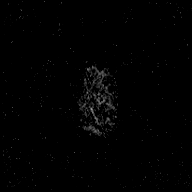

[Series 5: DWI · coronal · 3.0mm · 1.15mm/px · 4 of 46 slices shown (2 of 2)]
[im 1/46]
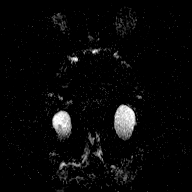
[im 16/46]
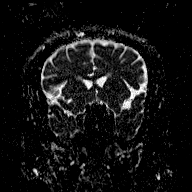
[im 31/46]
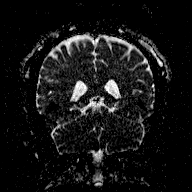
[im 46/46]
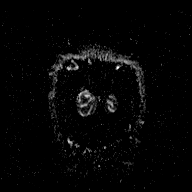

[Series 6: T1 · sagittal · 5.0mm · 0.45mm/px · 2 of 25 slices shown (1 of 2)]
[im 1/25]
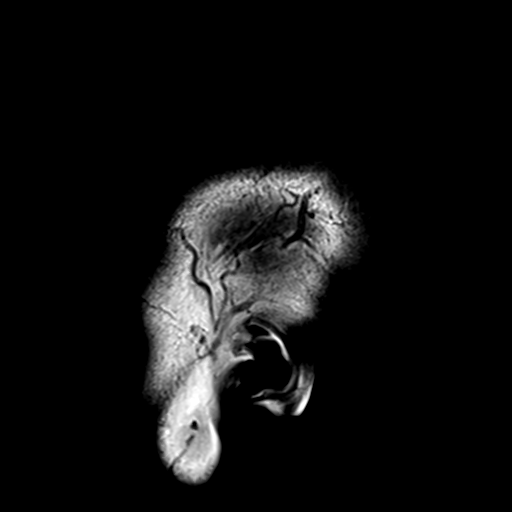
[im 25/25]
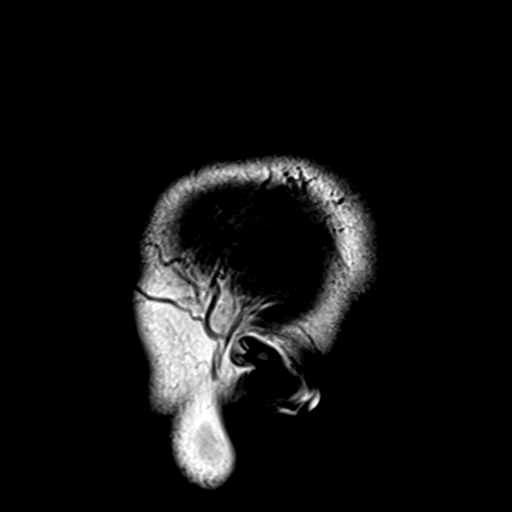

[Series 7: T2 · axial · 5.0mm · 0.72mm/px · z∈[-34,+119]mm · 2 of 23 slices shown (1 of 3)]
[im 1/23]
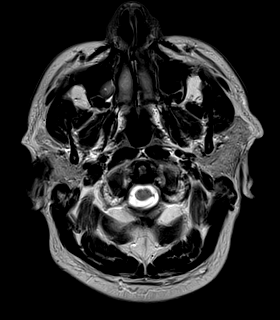
[im 23/23]
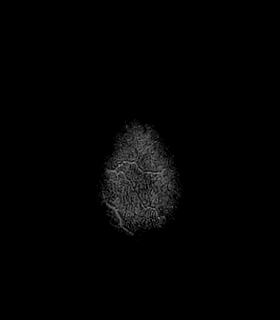

[Series 8: T2 · axial · 5.0mm · 0.72mm/px · z∈[-34,+119]mm · 2 of 23 slices shown (2 of 3)]
[im 1/23]
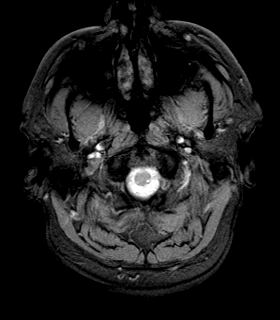
[im 23/23]
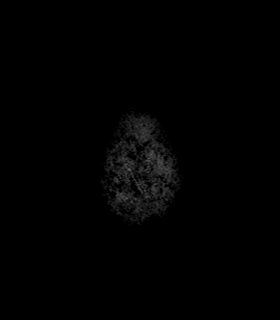

[Series 9: T1 · axial · 1.0mm · 1.00mm/px · z∈[-35,+123]mm · 15 of 159 slices shown (2 of 2)]
[im 1/159]
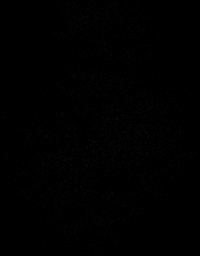
[im 12/159]
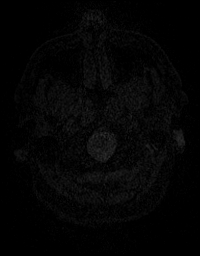
[im 23/159]
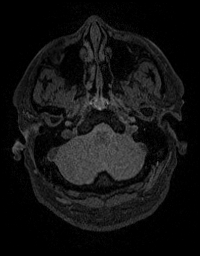
[im 34/159]
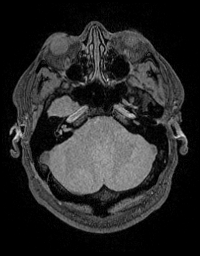
[im 46/159]
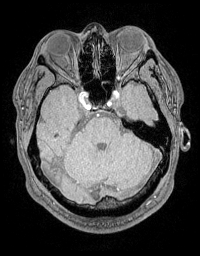
[im 57/159]
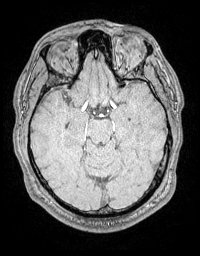
[im 68/159]
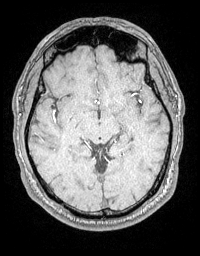
[im 80/159]
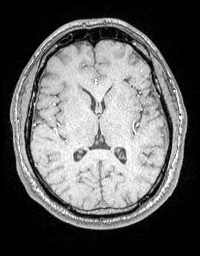
[im 91/159]
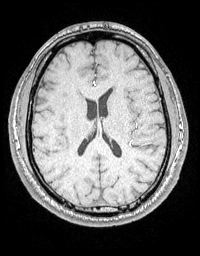
[im 102/159]
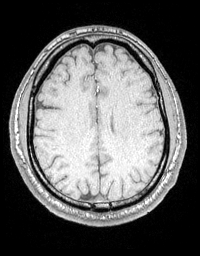
[im 113/159]
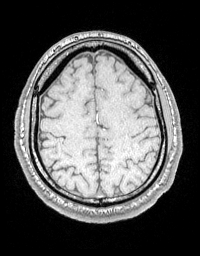
[im 125/159]
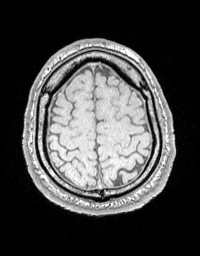
[im 136/159]
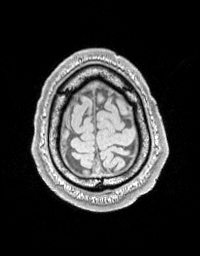
[im 147/159]
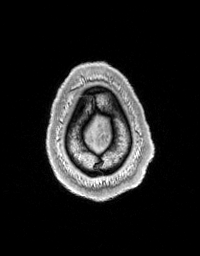
[im 159/159]
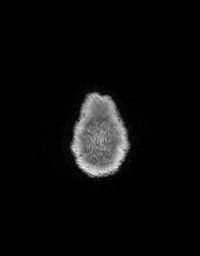

[Series 10: FLAIR · axial · 3.0mm · 0.45mm/px · z∈[-38,+123]mm · 5 of 55 slices shown]
[im 1/55]
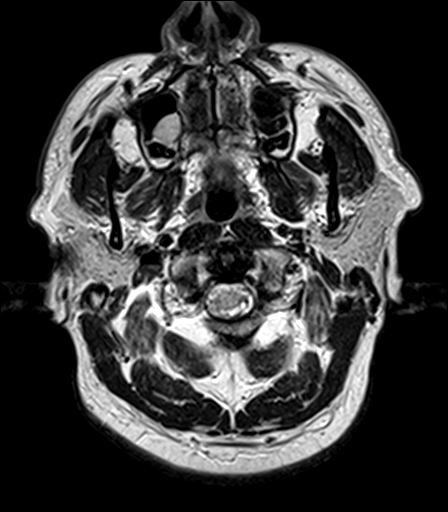
[im 14/55]
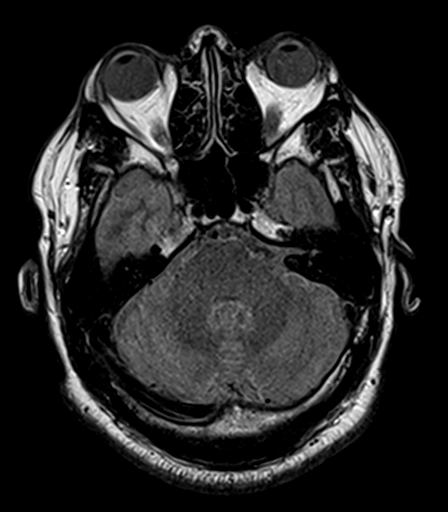
[im 28/55]
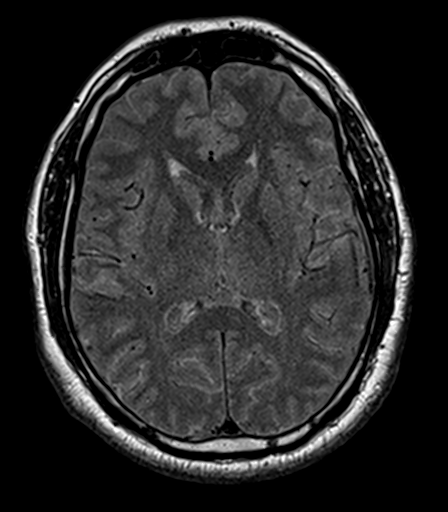
[im 41/55]
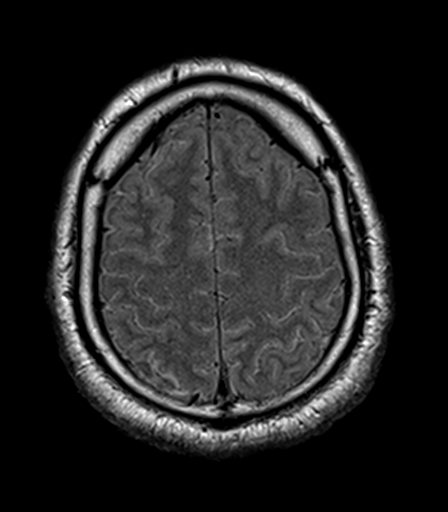
[im 55/55]
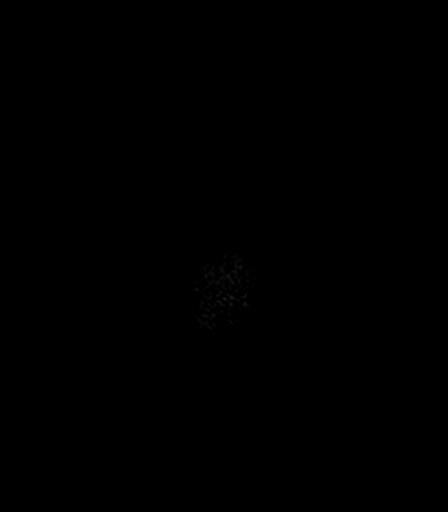

[Series 11: T2 · coronal · 5.0mm · 0.43mm/px · 3 of 29 slices shown (3 of 3)]
[im 1/29]
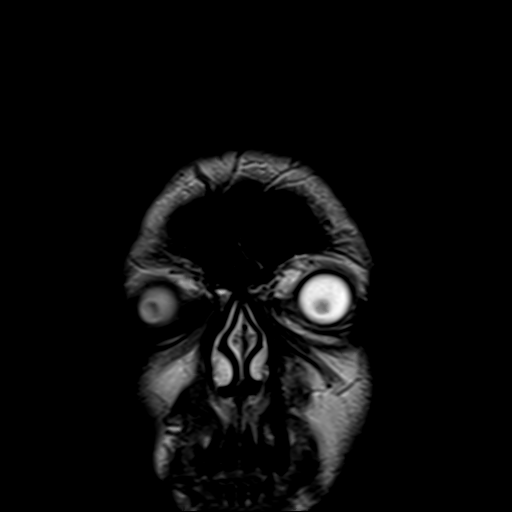
[im 15/29]
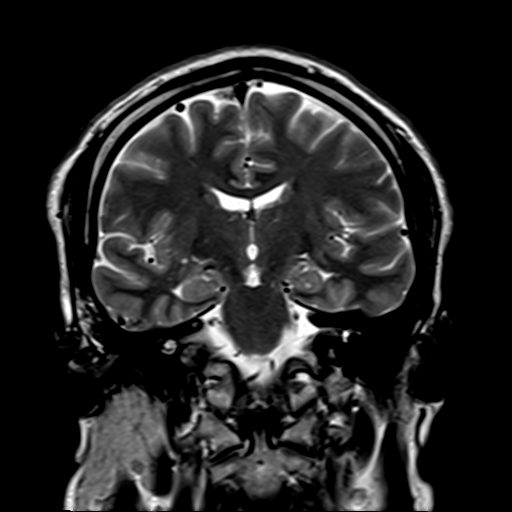
[im 29/29]
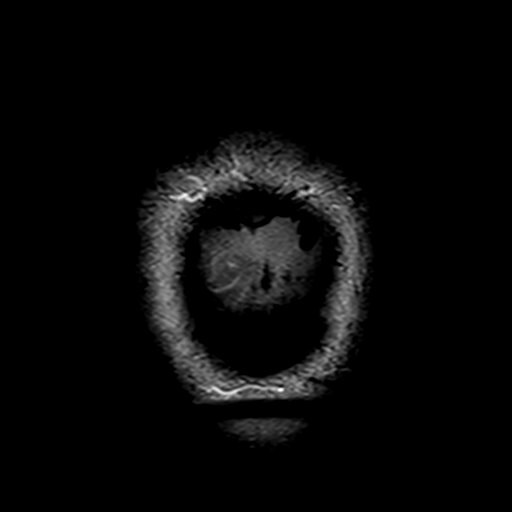

[Series 100: (id) ax · axial · 3.0mm · 1.20mm/px · z∈[-53,+109]mm · 5 of 55 slices shown]
[im 1/55]
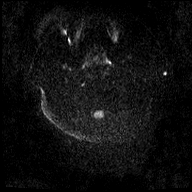
[im 14/55]
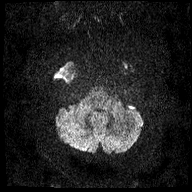
[im 28/55]
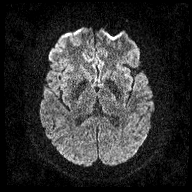
[im 41/55]
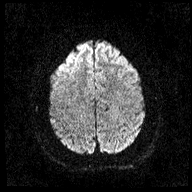
[im 55/55]
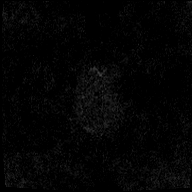

[Series 101: (id) cor · coronal · 3.0mm · 1.15mm/px · 4 of 46 slices shown]
[im 1/46]
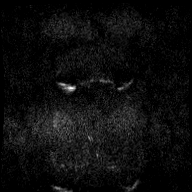
[im 16/46]
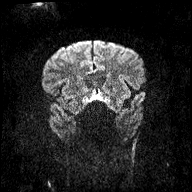
[im 31/46]
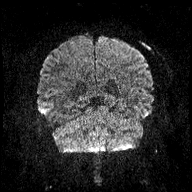
[im 46/46]
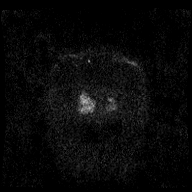

[48 of 48 positions shown; findings below may reference images not displayed]

FINDINGS: Brain: No acute infarction, hemorrhage, hydrocephalus, extra-axial
collection or mass lesion. Suboptimal FLAIR imaging due to
incomplete suppression of CSF/fluid signal. No white matter disease.
Normal brain volume.

Vascular: Normal flow voids, including vertebrobasilar

Skull and upper cervical spine: Normal marrow signal.

Sinuses/Orbits: Negative.  No mastoid or middle ear opacification.
IMPRESSION: Negative brain MRI.  No explanation for dizziness.

## 2020-05-15 IMAGING — DX DG ORBITS FOR FOREIGN BODY
2 series · 2 of 2 positions shown · non-contrast
Comparison: None.

CLINICAL DATA: Metal working/exposure; clearance prior to MRI.
49-year-old male

EXAM:
ORBITS FOR FOREIGN BODY - 2 VIEW

[orbits waters (1 of 2)]
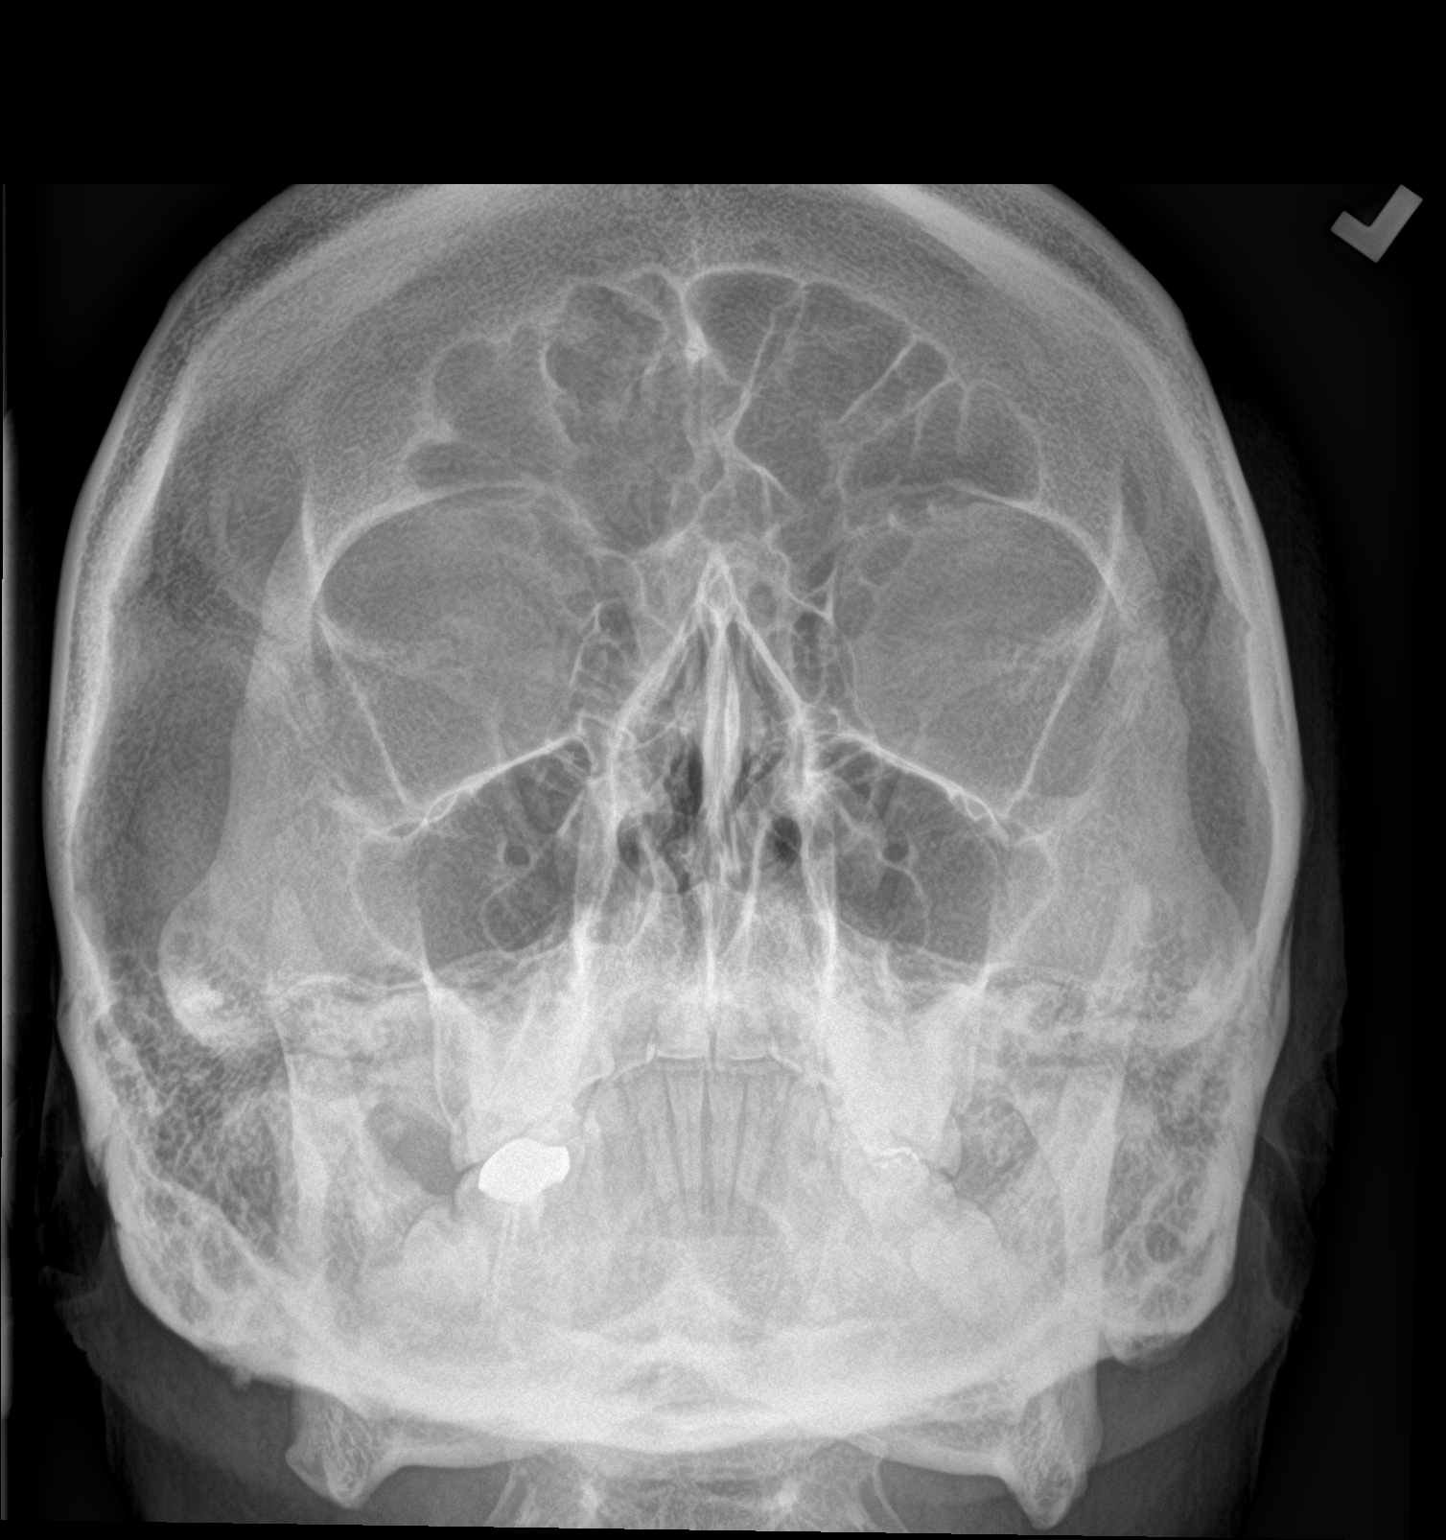

[orbits waters (2 of 2)]
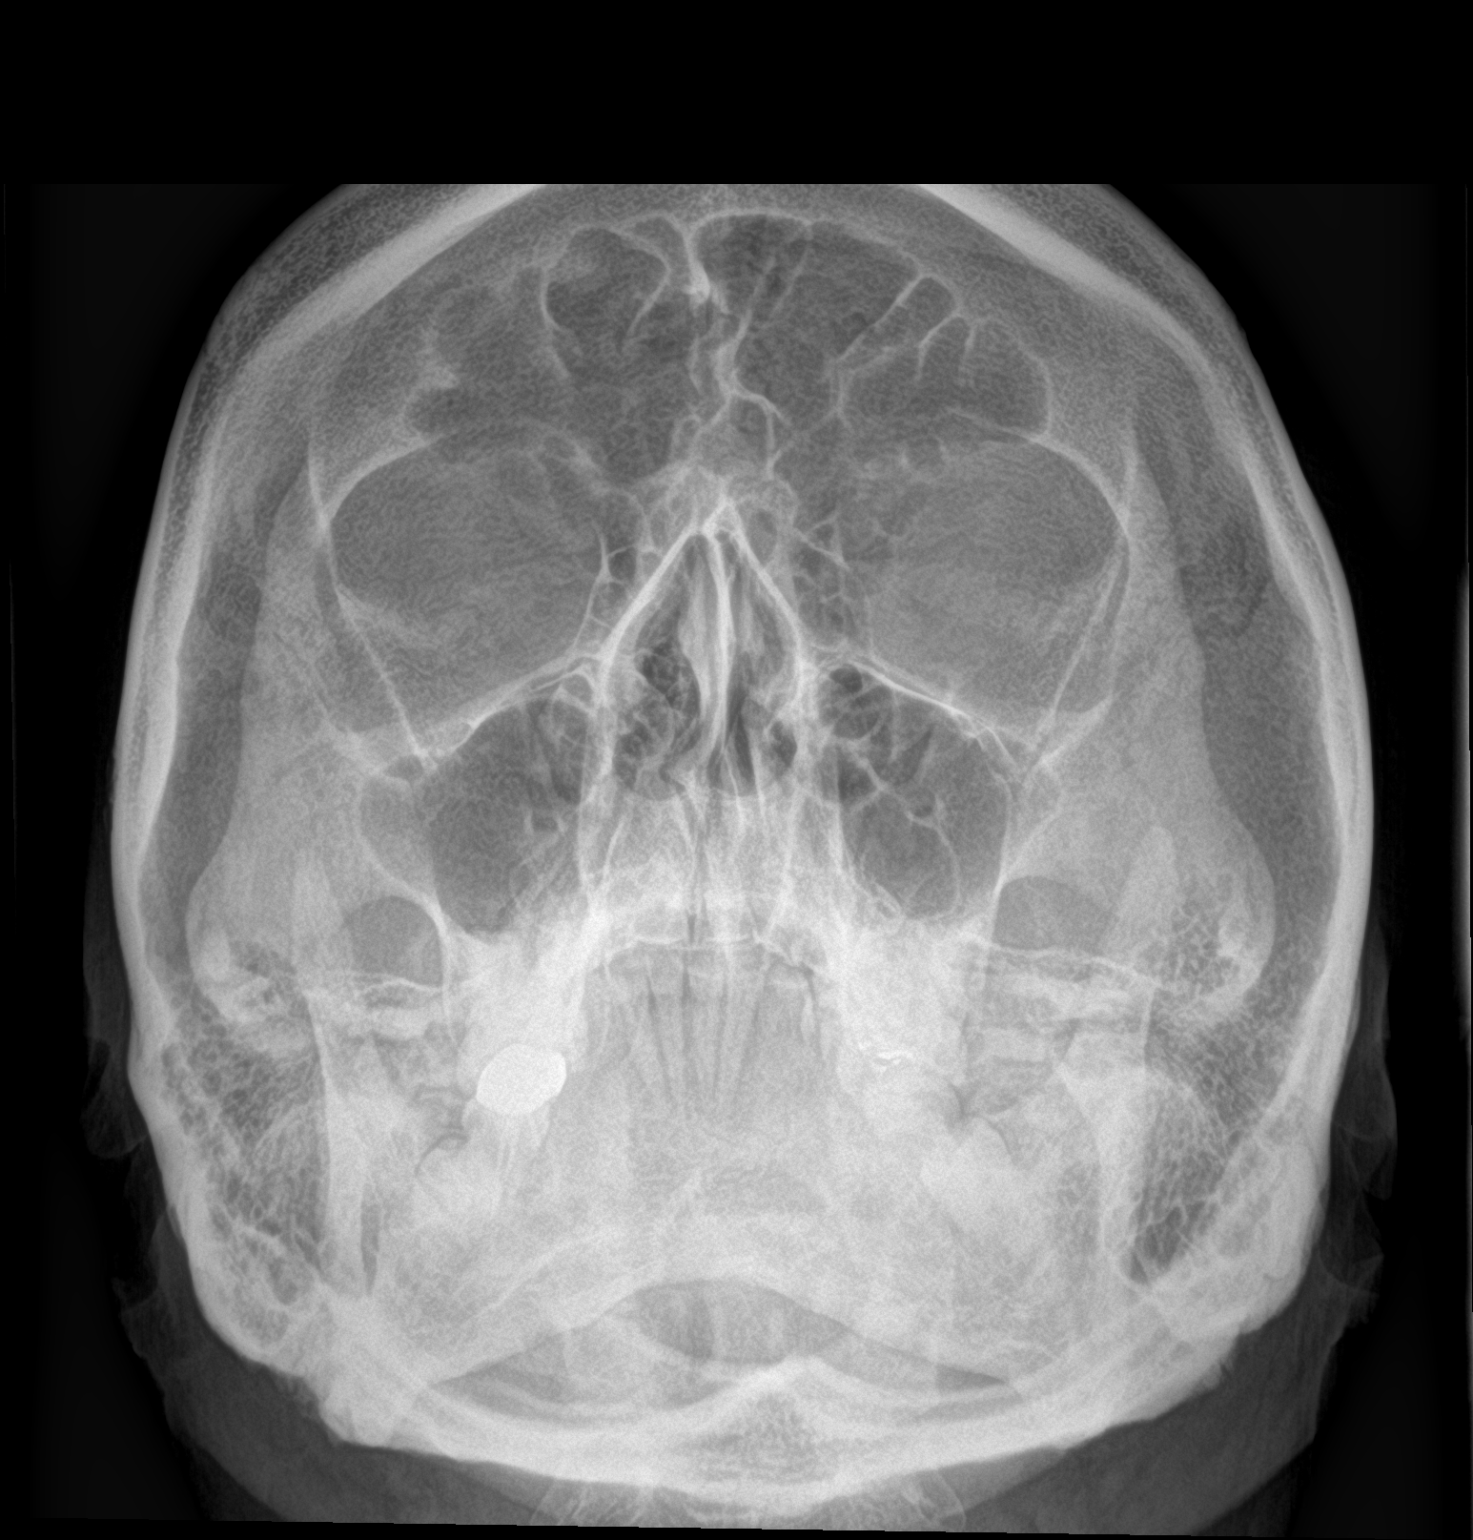

[2 of 2 positions shown; findings below may reference images not displayed]

FINDINGS: There is no evidence of metallic foreign body within the orbits. No
significant bone abnormality identified.
IMPRESSION: No evidence of metallic foreign body within the orbits.

## 2020-05-15 NOTE — Telephone Encounter (Signed)
Authorized with insurance.

## 2020-05-15 NOTE — Telephone Encounter (Signed)
Great news that MRI is negative for any explanation of dizziness.  No sinus infections of abnormalities.  Did you try the epley manuevers?  Has anything changed with dizziness.  Are you still having it with changing positions. I

## 2021-04-01 ENCOUNTER — Other Ambulatory Visit: Payer: Self-pay | Admitting: Physician Assistant

## 2021-04-01 DIAGNOSIS — R030 Elevated blood-pressure reading, without diagnosis of hypertension: Secondary | ICD-10-CM

## 2021-07-15 ENCOUNTER — Other Ambulatory Visit: Payer: Self-pay | Admitting: Physician Assistant

## 2021-07-15 DIAGNOSIS — R030 Elevated blood-pressure reading, without diagnosis of hypertension: Secondary | ICD-10-CM

## 2021-08-10 ENCOUNTER — Other Ambulatory Visit: Payer: Self-pay | Admitting: Physician Assistant

## 2021-08-10 DIAGNOSIS — R030 Elevated blood-pressure reading, without diagnosis of hypertension: Secondary | ICD-10-CM

## 2021-10-30 DIAGNOSIS — Z6832 Body mass index (BMI) 32.0-32.9, adult: Secondary | ICD-10-CM | POA: Diagnosis not present

## 2021-10-30 DIAGNOSIS — R030 Elevated blood-pressure reading, without diagnosis of hypertension: Secondary | ICD-10-CM | POA: Diagnosis not present

## 2021-10-30 DIAGNOSIS — M5416 Radiculopathy, lumbar region: Secondary | ICD-10-CM | POA: Diagnosis not present

## 2021-11-01 ENCOUNTER — Other Ambulatory Visit: Payer: Self-pay | Admitting: Neurological Surgery

## 2021-11-01 DIAGNOSIS — M5416 Radiculopathy, lumbar region: Secondary | ICD-10-CM

## 2021-11-03 ENCOUNTER — Ambulatory Visit (INDEPENDENT_AMBULATORY_CARE_PROVIDER_SITE_OTHER): Payer: BC Managed Care – PPO

## 2021-11-03 ENCOUNTER — Other Ambulatory Visit: Payer: Self-pay

## 2021-11-03 DIAGNOSIS — M545 Low back pain, unspecified: Secondary | ICD-10-CM

## 2021-11-03 DIAGNOSIS — G8929 Other chronic pain: Secondary | ICD-10-CM

## 2021-11-03 DIAGNOSIS — M5416 Radiculopathy, lumbar region: Secondary | ICD-10-CM

## 2021-11-03 IMAGING — MR MR LUMBAR SPINE W/O CM
4 of 5 series · 24 of 48 positions shown · non-contrast
Comparison: Prior radiograph from [DATE].

CLINICAL DATA: Initial evaluation for chronic lower back pain.

EXAM:
MRI LUMBAR SPINE WITHOUT CONTRAST
TECHNIQUE: Multiplanar, multisequence MR imaging of the lumbar spine was
performed. No intravenous contrast was administered.

[Series 2: T2 · sagittal · 4.0mm · 0.81mm/px · 6 of 15 slices shown (1 of 2)]
[im 1/15]
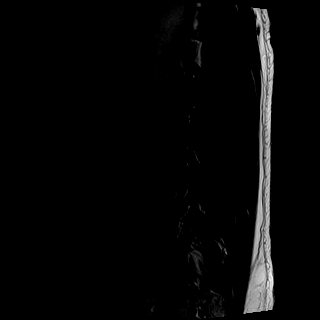
[im 3/15]
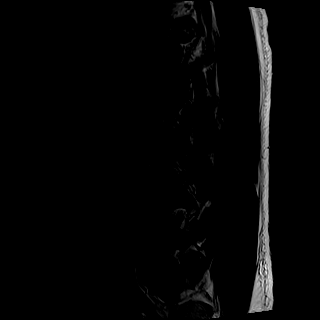
[im 6/15]
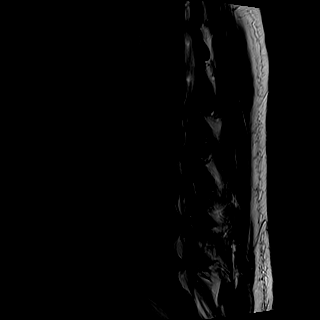
[im 9/15]
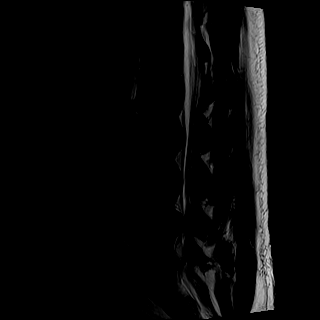
[im 12/15]
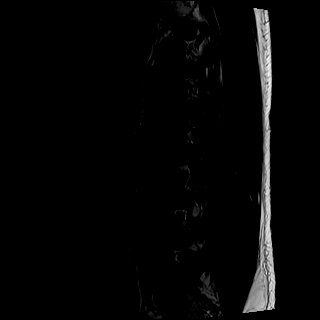
[im 15/15]
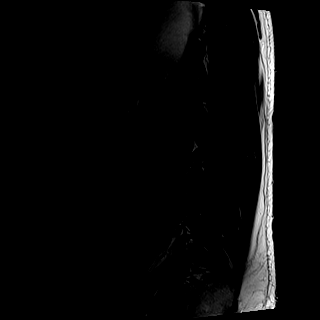

[Series 3: T1 · sagittal · 4.0mm · 0.41mm/px · 6 of 15 slices shown (1 of 2)]
[im 1/15]
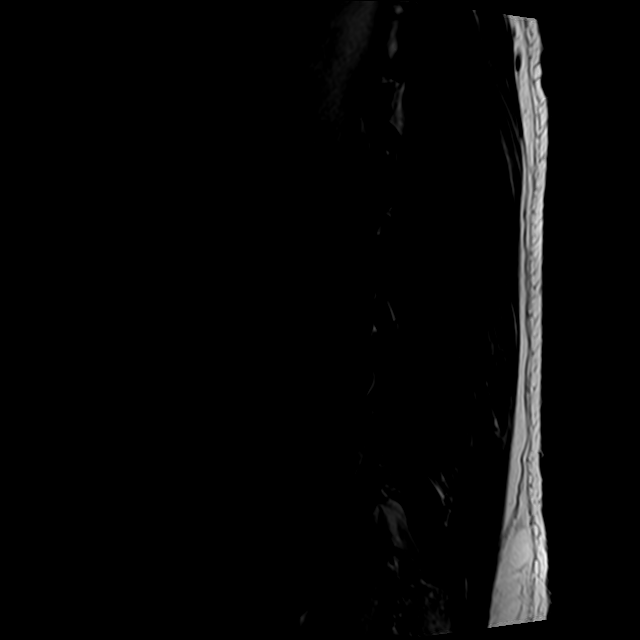
[im 3/15]
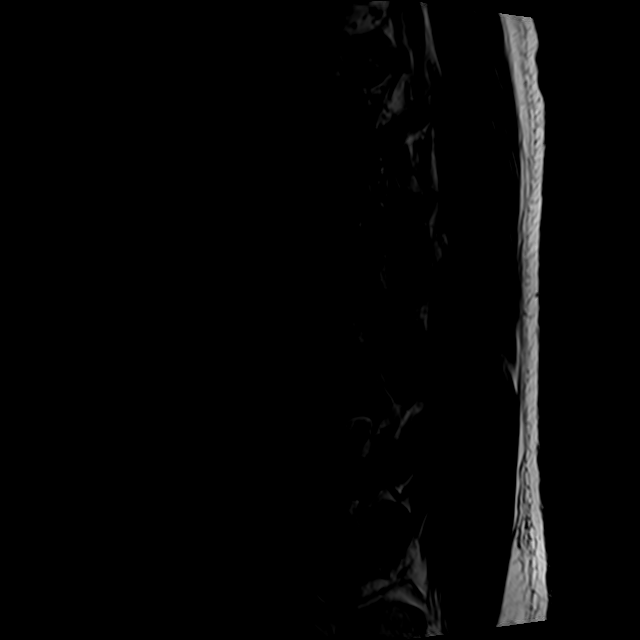
[im 6/15]
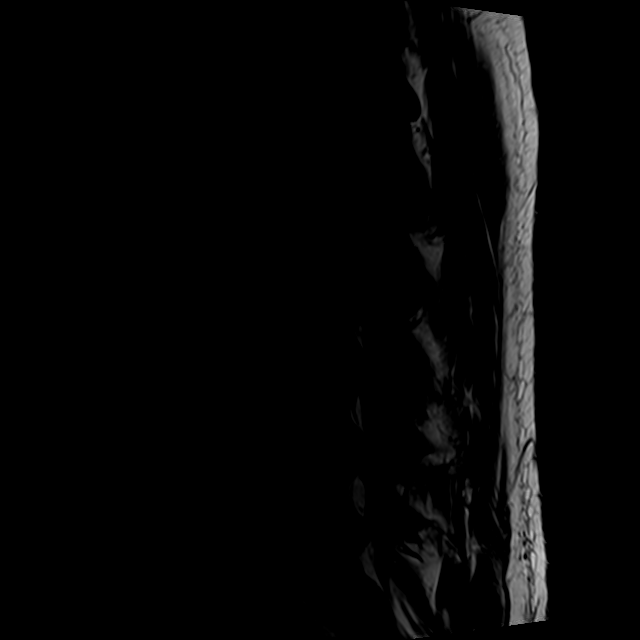
[im 9/15]
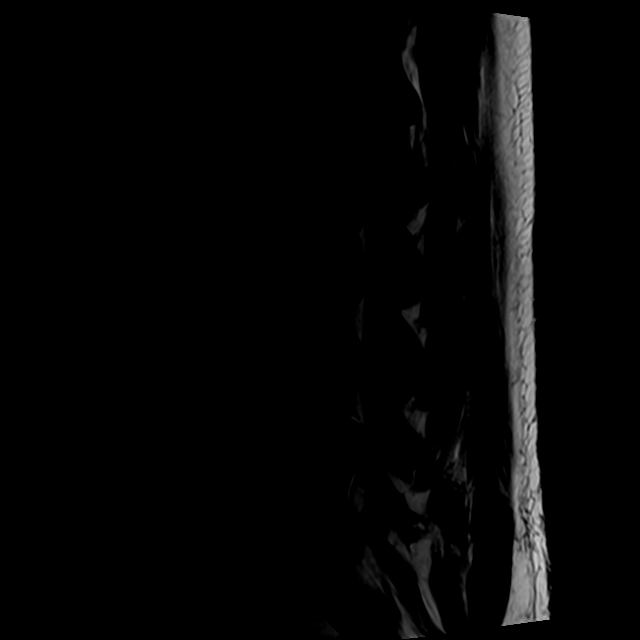
[im 12/15]
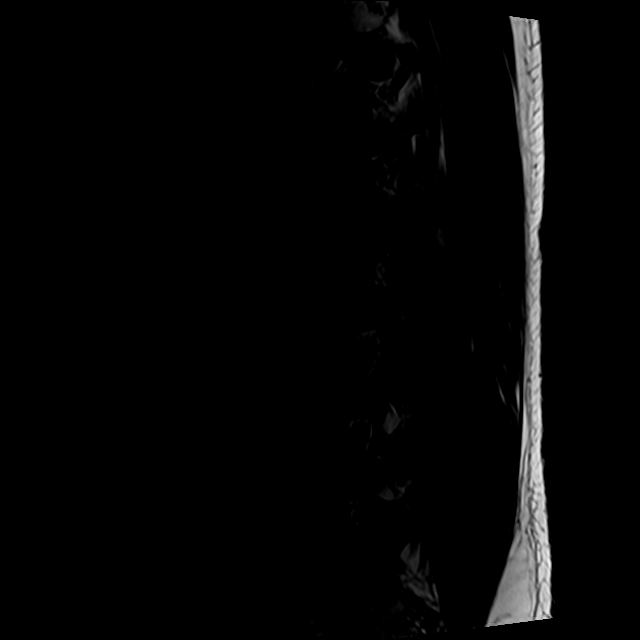
[im 15/15]
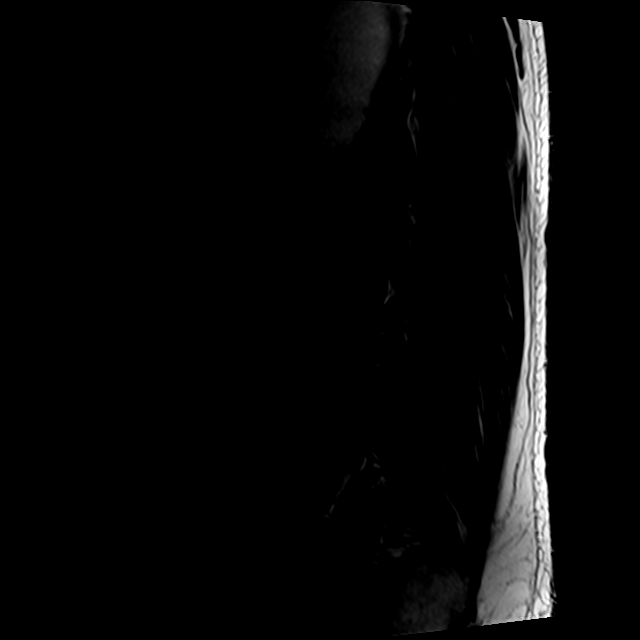

[Series 5: T2 · axial · 4.0mm · 0.78mm/px · z∈[-83,+108]mm · 9 of 38 slices shown (2 of 2)]
[im 1/38]
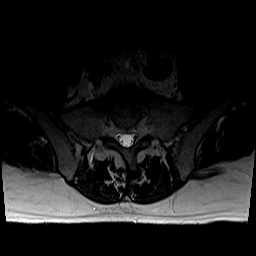
[im 6/38]
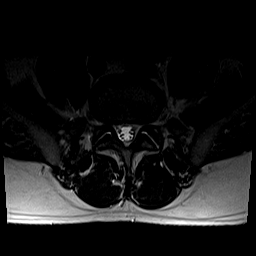
[im 11/38]
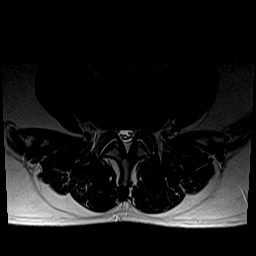
[im 16/38]
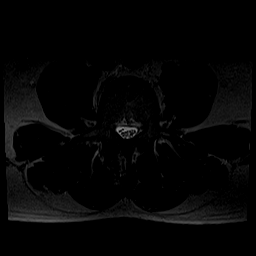
[im 19/38]
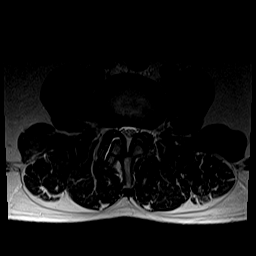
[im 22/38]
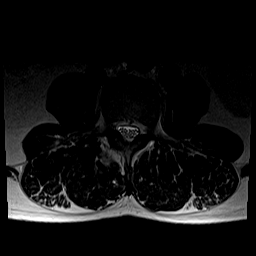
[im 27/38]
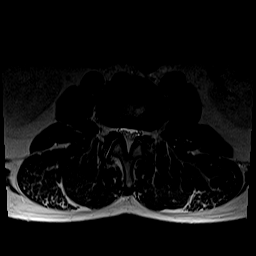
[im 32/38]
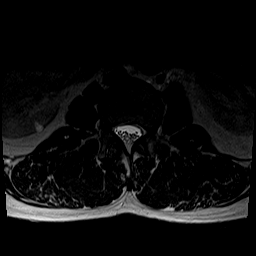
[im 38/38]
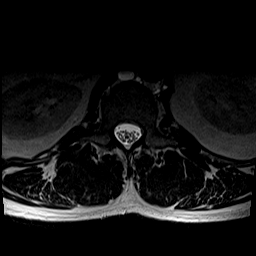

[Series 6: T1 · axial · 4.0mm · 0.39mm/px · z∈[-58,+78]mm · 3 of 38 slices shown (2 of 2)]
[im 6/38]
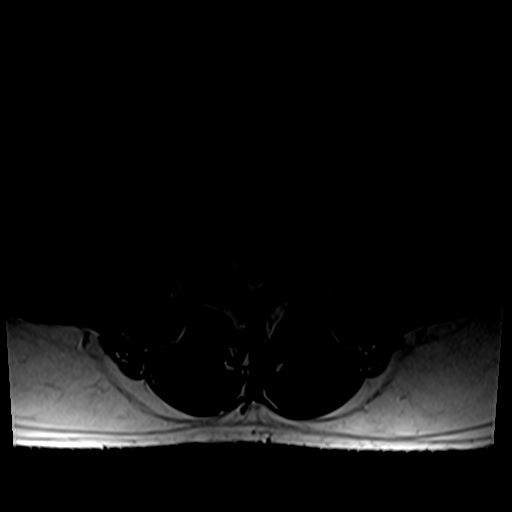
[im 19/38]
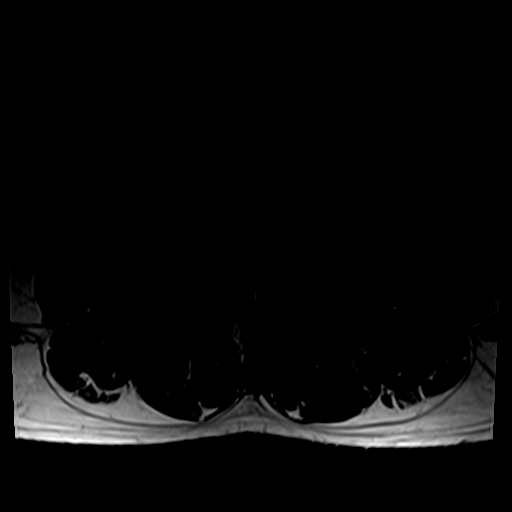
[im 32/38]
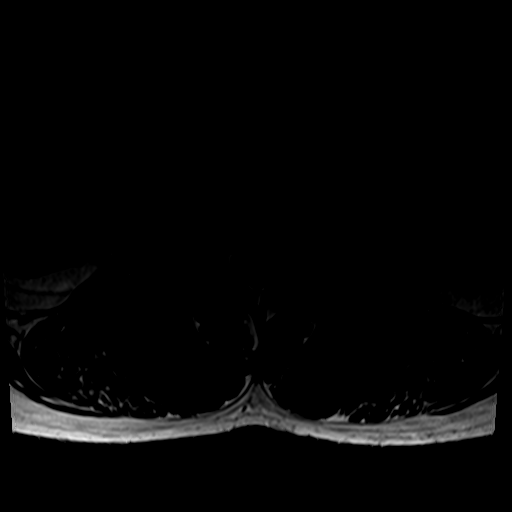

[24 of 48 positions shown; findings below may reference images not displayed]

FINDINGS: Segmentation: Standard. Lowest well-formed disc space labeled the
L5-S1 level.

Alignment: Straightening of the normal lumbar lordosis. Trace
retrolisthesis of L4 on L5 and L5 on S1.

Vertebrae: Vertebral body height maintained without acute or chronic
fracture. Bone marrow signal intensity within normal limits. No
discrete or worrisome osseous lesions. Reactive marrow edema present
about the right L3-4 facet due to facet arthritis (series 4, image
5). No other abnormal marrow edema.

Conus medullaris and cauda equina: Conus extends to the T12-L1
level. Conus and cauda equina appear normal.

Paraspinal and other soft tissues: Paraspinous soft tissues within
normal limits. Subcentimeter simple cyst noted within the right
kidney. Visualized visceral structures otherwise unremarkable.

Disc levels:

L1-2:  Unremarkable.

L2-3: Mild diffuse disc bulge with disc desiccation. Moderate right
with mild left facet hypertrophy. Prominence of the dorsal epidural
fat. Resultant mild canal with bilateral lateral recess stenosis.
Foramina remain patent.

L3-4: Mild annular disc bulge. Superimposed small right foraminal to
extraforaminal disc protrusion contacts the exiting right L3 nerve
root (series 6, image 19). Moderate right with mild left facet
hypertrophy. Associated mild marrow edema with trace joint effusion
about the right facet. Resultant mild-to-moderate canal with
bilateral lateral recess stenosis. Mild right L3 foraminal
narrowing. Left neural foramina remains patent.

L4-5: Trace retrolisthesis. Mild diffuse disc bulge with disc
desiccation. Anterior endplate spurring. Superimposed left foraminal
to extraforaminal disc protrusion closely approximates the exiting
left L4 nerve root (series 5, image 28). Associated annular fissure.
Mild bilateral facet hypertrophy. Mild canal with bilateral lateral
recess stenosis. Mild to moderate left L4 foraminal narrowing. Right
neural foramina remains patent.

L5-S1: Trace retrolisthesis. Mild disc bulge with disc desiccation.
Superimposed broad right subarticular to foraminal disc protrusion
(series 6, image 34). Associated annular fissure. Disc material
contacts the descending right S1 nerve root as it courses through
the right lateral recess. Minimal facet spurring. Mild narrowing of
the right lateral recess. Central canal remains patent. No
significant foraminal encroachment.
IMPRESSION: 1. Small right foraminal to extraforaminal disc protrusion at L3-4,
contacting and potentially irritating the exiting right L3 nerve
root.
2. Left foraminal to extraforaminal disc protrusion at L4-5,
potentially affecting the exiting left L4 nerve root.
3. Broad right subarticular to foraminal disc protrusion at L5-S1,
the descending right S1 nerve root.
4. Moderate right-sided facet hypertrophy at L3-4 with associated
reactive marrow edema. Finding could serve as a source for lower
back pain.

## 2021-11-06 DIAGNOSIS — M5416 Radiculopathy, lumbar region: Secondary | ICD-10-CM | POA: Diagnosis not present

## 2021-11-14 DIAGNOSIS — M5416 Radiculopathy, lumbar region: Secondary | ICD-10-CM | POA: Diagnosis not present

## 2021-11-14 DIAGNOSIS — M5116 Intervertebral disc disorders with radiculopathy, lumbar region: Secondary | ICD-10-CM | POA: Diagnosis not present

## 2021-11-14 DIAGNOSIS — M48061 Spinal stenosis, lumbar region without neurogenic claudication: Secondary | ICD-10-CM | POA: Diagnosis not present

## 2024-09-30 ENCOUNTER — Encounter: Payer: Self-pay | Admitting: Physician Assistant

## 2024-09-30 ENCOUNTER — Ambulatory Visit (INDEPENDENT_AMBULATORY_CARE_PROVIDER_SITE_OTHER): Admitting: Physician Assistant

## 2024-09-30 VITALS — BP 132/87 | HR 83

## 2024-09-30 DIAGNOSIS — E6609 Other obesity due to excess calories: Secondary | ICD-10-CM

## 2024-09-30 DIAGNOSIS — Z13220 Encounter for screening for lipoid disorders: Secondary | ICD-10-CM | POA: Diagnosis not present

## 2024-09-30 DIAGNOSIS — Z79899 Other long term (current) drug therapy: Secondary | ICD-10-CM

## 2024-09-30 DIAGNOSIS — Z6836 Body mass index (BMI) 36.0-36.9, adult: Secondary | ICD-10-CM | POA: Diagnosis not present

## 2024-09-30 DIAGNOSIS — Z1211 Encounter for screening for malignant neoplasm of colon: Secondary | ICD-10-CM | POA: Diagnosis not present

## 2024-09-30 DIAGNOSIS — Z131 Encounter for screening for diabetes mellitus: Secondary | ICD-10-CM | POA: Diagnosis not present

## 2024-09-30 DIAGNOSIS — E66812 Obesity, class 2: Secondary | ICD-10-CM

## 2024-09-30 DIAGNOSIS — Z125 Encounter for screening for malignant neoplasm of prostate: Secondary | ICD-10-CM

## 2024-09-30 DIAGNOSIS — I10 Essential (primary) hypertension: Secondary | ICD-10-CM | POA: Diagnosis not present

## 2024-09-30 MED ORDER — CHLORTHALIDONE 25 MG PO TABS: 12.5000 mg | ORAL_TABLET | Freq: Every day | ORAL | 0 refills | Status: DC

## 2024-09-30 MED ORDER — CHLORTHALIDONE 25 MG PO TABS: 12.5000 mg | ORAL_TABLET | Freq: Every day | ORAL | 0 refills | Status: AC

## 2024-09-30 NOTE — Progress Notes (Signed)
 "  Established Patient Office Visit  Subjective   Patient ID: Alexander Trujillo, male    DOB: 1970-10-14  Age: 54 y.o. MRN: [id number]  Chief Complaint  Patient presents with   Medical Management of Chronic Issues    HPI .Discussed the use of AI scribe software for clinical note transcription with the patient, who gave verbal consent to proceed.  History of Present Illness Alexander Trujillo is a 54 year old male with hypertension who presents for blood pressure management and follow-up after recent flu and pneumonia.  Hypertension - Previously treated with chlorthalidone , which was effective - Currently not taking antihypertensive medication - Home blood pressure monitoring averages around 132 mmHg - No symptoms of headache, chest pain, or vision changes - No peripheral edema - Feels generally well - Wife has been checking his blood pressure and recommended evaluation and blood work - Experiences 'white coat syndrome' affecting in-office blood pressure readings  Recent respiratory infection - Recently recovered from influenza and mild pneumonia - Treated with antibiotics for seven days - No ongoing respiratory symptoms  Nutritional intake - Typically does not eat lunch due to work schedule - Usually consumes one or two meals per day  Sleep quality - Sleeping well at night - No snoring or apneic events    ROS See HPI.    Objective:     BP 132/87   Pulse 83  BP Readings from Last 3 Encounters:  09/30/24 132/87  04/25/20 119/66  06/30/17 138/80   Wt Readings from Last 3 Encounters:  04/25/20 229 lb (103.9 kg)  03/23/20 232 lb (105.2 kg)  06/30/17 227 lb (103 kg)      Physical Exam Constitutional:      Appearance: Normal appearance.  HENT:     Head: Normocephalic.  Cardiovascular:     Rate and Rhythm: Normal rate and regular rhythm.  Pulmonary:     Effort: Pulmonary effort is normal.     Breath sounds: Normal breath sounds.  Musculoskeletal:     Cervical back:  Normal range of motion and neck supple.     Right lower leg: No edema.     Left lower leg: No edema.  Neurological:     General: No focal deficit present.     Mental Status: He is alert and oriented to person, place, and time.  Psychiatric:        Mood and Affect: Mood normal.         Assessment & Plan:  .Falon was seen today for medical management of chronic issues.  Diagnoses and all orders for this visit:  Primary hypertension -     CMP14+EGFR -     chlorthalidone  (HYGROTON ) 25 MG tablet; Take 0.5 tablets (12.5 mg total) by mouth daily.  Colon cancer screening -     Cologuard  Screening for diabetes mellitus -     CMP14+EGFR  Screening for lipid disorders -     Lipid panel  Class 2 obesity due to excess calories without serious comorbidity with body mass index (BMI) of 36.0 to 36.9 in adult -     Lipid panel -     CMP14+EGFR -     CBC w/Diff/Platelet -     VITAMIN D  25 Hydroxy (Vit-D Deficiency, Fractures) -     PSA -     TSH  Prostate cancer screening -     PSA  Medication management -     CMP14+EGFR -     CBC w/Diff/Platelet -  VITAMIN D  25 Hydroxy (Vit-D Deficiency, Fractures) -     PSA -     TSH  Other orders -     Discontinue: chlorthalidone  (HYGROTON ) 25 MG tablet; Take 0.5 tablets (12.5 mg total) by mouth daily.   Assessment & Plan Hypertension  Blood pressure averages 132 mmHg at home, possible white coat hypertension, no current antihypertensive medication. - Rechecked blood pressure in clinic and did improve significantly. - Restart chlorthalidone  daily in the morning.  - Ordered blood work: cholesterol, kidney, liver, vitamin D  levels. - Discussed lifestyle management of hypertension as well.   General Health Maintenance Due for pneumonia and tetanus vaccinations, recent pneumonia treated, Cologuard test discussed for colon cancer screening. - Schedule pneumonia vaccination in 3 months. - Discuss tetanus vaccination at next visit. -  Ordered Cologuard test.    Return in about 3 months (around 12/29/2024) for BP recheck and get pneumonia vaccine.    Abdurrahman Petersheim, PA-C  "

## 2024-09-30 NOTE — Patient Instructions (Signed)
 Managing Your Hypertension Hypertension, also called high blood pressure, is when the force of the blood pressing against the walls of the arteries is too strong. Arteries are blood vessels that carry blood from your heart throughout your body. Hypertension forces the heart to work harder to pump blood and may cause the arteries to become narrow or stiff. Understanding blood pressure readings A blood pressure reading includes a higher number over a lower number: The first, or top, number is called the systolic pressure. It is a measure of the pressure in your arteries as your heart beats. The second, or bottom number, is called the diastolic pressure. It is a measure of the pressure in your arteries as the heart relaxes. For most people, a normal blood pressure is below 120/80. Your personal target blood pressure may vary depending on your medical conditions, your age, and other factors. Blood pressure is classified into four stages. Based on your blood pressure reading, your health care provider may use the following stages to determine what type of treatment you need, if any. Systolic pressure and diastolic pressure are measured in a unit called millimeters of mercury (mmHg). Normal Systolic pressure: below 120. Diastolic pressure: below 80. Elevated Systolic pressure: 120-129. Diastolic pressure: below 80. Hypertension stage 1 Systolic pressure: 130-139. Diastolic pressure: 80-89. Hypertension stage 2 Systolic pressure: 140 or above. Diastolic pressure: 90 or above. How can this condition affect me? Managing your hypertension is very important. Over time, hypertension can damage the arteries and decrease blood flow to parts of the body, including the brain, heart, and kidneys. Having untreated or uncontrolled hypertension can lead to: A heart attack. A stroke. A weakened blood vessel (aneurysm). Heart failure. Kidney damage. Eye damage. Memory and concentration problems. Vascular  dementia. What actions can I take to manage this condition? Hypertension can be managed by making lifestyle changes and possibly by taking medicines. Your health care provider will help you make a plan to bring your blood pressure within a normal range. You may be referred for counseling on a healthy diet and physical activity. Nutrition  Eat a diet that is high in fiber and potassium, and low in salt (sodium), added sugar, and fat. An example eating plan is called the DASH diet. DASH stands for Dietary Approaches to Stop Hypertension. To eat this way: Eat plenty of fresh fruits and vegetables. Try to fill one-half of your plate at each meal with fruits and vegetables. Eat whole grains, such as whole-wheat pasta, brown rice, or whole-grain bread. Fill about one-fourth of your plate with whole grains. Eat low-fat dairy products. Avoid fatty cuts of meat, processed or cured meats, and poultry with skin. Fill about one-fourth of your plate with lean proteins such as fish, chicken without skin, beans, eggs, and tofu. Avoid pre-made and processed foods. These tend to be higher in sodium, added sugar, and fat. Reduce your daily sodium intake. Many people with hypertension should eat less than 1,500 mg of sodium a day. Lifestyle  Work with your health care provider to maintain a healthy body weight or to lose weight. Ask what an ideal weight is for you. Get at least 30 minutes of exercise that causes your heart to beat faster (aerobic exercise) most days of the week. Activities may include walking, swimming, or biking. Include exercise to strengthen your muscles (resistance exercise), such as weight lifting, as part of your weekly exercise routine. Try to do these types of exercises for 30 minutes at least 3 days a week. Do  not use any products that contain nicotine  or tobacco. These products include cigarettes, chewing tobacco, and vaping devices, such as e-cigarettes. If you need help quitting, ask your  health care provider. Control any long-term (chronic) conditions you have, such as high cholesterol or diabetes. Identify your sources of stress and find ways to manage stress. This may include meditation, deep breathing, or making time for fun activities. Alcohol use Do not drink alcohol if: Your health care provider tells you not to drink. You are pregnant, may be pregnant, or are planning to become pregnant. If you drink alcohol: Limit how much you have to: 0-1 drink a day for women. 0-2 drinks a day for men. Know how much alcohol is in your drink. In the [place] one drink equals one 12 oz bottle of beer (355 mL), one 5 oz glass of wine (148 mL), or one 1 oz glass of hard liquor (44 mL). Medicines Your health care provider may prescribe medicine if lifestyle changes are not enough to get your blood pressure under control and if: Your systolic blood pressure is 130 or higher. Your diastolic blood pressure is 80 or higher. Take medicines only as told by your health care provider. Follow the directions carefully. Blood pressure medicines must be taken as told by your health care provider. The medicine does not work as well when you skip doses. Skipping doses also puts you at risk for problems. Monitoring Before you monitor your blood pressure: Do not smoke, drink caffeinated beverages, or exercise within 30 minutes before taking a measurement. Use the bathroom and empty your bladder (urinate). Sit quietly for at least 5 minutes before taking measurements. Monitor your blood pressure at home as told by your health care provider. To do this: Sit with your back straight and supported. Place your feet flat on the floor. Do not cross your legs. Support your arm on a flat surface, such as a table. Make sure your upper arm is at heart level. Each time you measure, take two or three readings one minute apart and record the results. You may also need to have your blood pressure checked regularly by  your health care provider. General information Talk with your health care provider about your diet, exercise habits, and other lifestyle factors that may be contributing to hypertension. Review all the medicines you take with your health care provider because there may be side effects or interactions. Keep all follow-up visits. Your health care provider can help you create and adjust your plan for managing your high blood pressure. Where to find more information National Heart, Lung, and Blood Institute: [employer]is American Heart Association: www.heart.org Contact a health care provider if: You think you are having a reaction to medicines you have taken. You have repeated (recurrent) headaches. You feel dizzy. You have swelling in your ankles. You have trouble with your vision. Get help right away if: You develop a severe headache or confusion. You have unusual weakness or numbness, or you feel faint. You have severe pain in your chest or abdomen. You vomit repeatedly. You have trouble breathing. These symptoms may be an emergency. Get help right away. Call 911. Do not wait to see if the symptoms will go away. Do not drive yourself to the hospital. Summary Hypertension is when the force of blood pumping through your arteries is too strong. If this condition is not controlled, it may put you at risk for serious complications. Your personal target blood pressure may vary depending on your medical conditions,  your age, and other factors. For most people, a normal blood pressure is less than 120/80. Hypertension is managed by lifestyle changes, medicines, or both. Lifestyle changes to help manage hypertension include losing weight, eating a healthy, low-sodium diet, exercising more, stopping smoking, and limiting alcohol. This information is not intended to replace advice given to you by your health care provider. Make sure you discuss any questions you have with your health care  provider. Document Revised: 05/23/2021 Document Reviewed: 05/23/2021 Elsevier Patient Education  2024 [employer].

## 2024-10-01 LAB — CBC WITH DIFFERENTIAL/PLATELET
Basophils Absolute: 0.1 x10E3/uL (ref 0.0–0.2)
Basos: 1 %
EOS (ABSOLUTE): 0.2 x10E3/uL (ref 0.0–0.4)
Eos: 3 %
Hematocrit: 42.9 % (ref 37.5–51.0)
Hemoglobin: 14 g/dL (ref 13.0–17.7)
Immature Grans (Abs): 0 x10E3/uL (ref 0.0–0.1)
Immature Granulocytes: 0 %
Lymphocytes Absolute: 1.8 x10E3/uL (ref 0.7–3.1)
Lymphs: 35 %
MCH: 29.1 pg (ref 26.6–33.0)
MCHC: 32.6 g/dL (ref 31.5–35.7)
MCV: 89 fL (ref 79–97)
Monocytes Absolute: 0.3 x10E3/uL (ref 0.1–0.9)
Monocytes: 7 %
Neutrophils Absolute: 2.7 x10E3/uL (ref 1.4–7.0)
Neutrophils: 54 %
Platelets: 315 x10E3/uL (ref 150–450)
RBC: 4.81 x10E6/uL (ref 4.14–5.80)
RDW: 13 % (ref 11.6–15.4)
WBC: 5 x10E3/uL (ref 3.4–10.8)

## 2024-10-01 LAB — LIPID PANEL
Chol/HDL Ratio: 4.3 ratio (ref 0.0–5.0)
Cholesterol, Total: 180 mg/dL (ref 100–199)
HDL: 42 mg/dL
LDL Chol Calc (NIH): 121 mg/dL — ABNORMAL HIGH (ref 0–99)
Triglycerides: 90 mg/dL (ref 0–149)
VLDL Cholesterol Cal: 17 mg/dL (ref 5–40)

## 2024-10-01 LAB — CMP14+EGFR
ALT: 19 IU/L (ref 0–44)
AST: 17 IU/L (ref 0–40)
Albumin: 4.2 g/dL (ref 3.8–4.9)
Alkaline Phosphatase: 72 IU/L (ref 47–123)
BUN/Creatinine Ratio: 9 (ref 9–20)
BUN: 10 mg/dL (ref 6–24)
Bilirubin Total: 0.5 mg/dL (ref 0.0–1.2)
CO2: 25 mmol/L (ref 20–29)
Calcium: 9.7 mg/dL (ref 8.7–10.2)
Chloride: 101 mmol/L (ref 96–106)
Creatinine, Ser: 1.14 mg/dL (ref 0.76–1.27)
Globulin, Total: 3.4 g/dL (ref 1.5–4.5)
Glucose: 84 mg/dL (ref 70–99)
Potassium: 4.4 mmol/L (ref 3.5–5.2)
Sodium: 140 mmol/L (ref 134–144)
Total Protein: 7.6 g/dL (ref 6.0–8.5)
eGFR: 76 mL/min/1.73

## 2024-10-01 LAB — TSH: TSH: 0.408 u[IU]/mL — ABNORMAL LOW (ref 0.450–4.500)

## 2024-10-01 LAB — VITAMIN D 25 HYDROXY (VIT D DEFICIENCY, FRACTURES): Vit D, 25-Hydroxy: 16.2 ng/mL — ABNORMAL LOW (ref 30.0–100.0)

## 2024-10-01 LAB — PSA: Prostate Specific Ag, Serum: 4.3 ng/mL — ABNORMAL HIGH (ref 0.0–4.0)

## 2024-10-03 ENCOUNTER — Ambulatory Visit: Payer: Self-pay | Admitting: Physician Assistant

## 2024-10-03 DIAGNOSIS — R7989 Other specified abnormal findings of blood chemistry: Secondary | ICD-10-CM | POA: Insufficient documentation

## 2024-10-03 DIAGNOSIS — E559 Vitamin D deficiency, unspecified: Secondary | ICD-10-CM | POA: Insufficient documentation

## 2024-10-03 DIAGNOSIS — R972 Elevated prostate specific antigen [PSA]: Secondary | ICD-10-CM | POA: Insufficient documentation

## 2024-10-03 NOTE — Progress Notes (Signed)
 Alexander Trujillo,   Hemoglobin looks good.  Kidney, liver, glucose looks good.  Vitamin d  low. Start 2000 units daily with dairy for better absorption.  Your PSA is elevated. Are you having any urinary symptoms? Weak stream? I would like for you to see urology for consult.  TSH is elevated as well. Will check free t3,t4, and thyrotrophin receptor antibodies and add to labs.   LDL, bad cholesterol, elevated today. 10 year cardiovascular risk is above 7.5 percent and suggest that you start cholesterol lowering drug. Are you ok with this being sent to pharmacy?   .The 10-year ASCVD risk score (Arnett DK, et al., 2019) is: 11.8%   Values used to calculate the score:     Age: 70 years     Clinically relevant sex: Male     Is Non-Hispanic African American: Yes     Diabetic: No     Tobacco smoker: No     Systolic Blood Pressure: 132 mmHg     Is BP treated: Yes     HDL Cholesterol: 42 mg/dL     Total Cholesterol: 180 mg/dL

## 2024-10-05 ENCOUNTER — Telehealth: Payer: Self-pay

## 2024-10-05 ENCOUNTER — Telehealth: Payer: Self-pay | Admitting: Physician Assistant

## 2024-10-05 DIAGNOSIS — R972 Elevated prostate specific antigen [PSA]: Secondary | ICD-10-CM

## 2024-10-05 NOTE — Telephone Encounter (Signed)
 Patient informed of message from [facility] - he states he is taking vit D supplement  He will return in 4 weeks for repeat PSA  and in 4-6 months for lipid panel rechek

## 2024-10-05 NOTE — Telephone Encounter (Signed)
 Spoke with patient.

## 2024-10-05 NOTE — Progress Notes (Signed)
 We don't have extended thyroid labs back yet.  I would make sure taking vitamin D .  Recheck cholesterol in 4-6 months after diet changes.  If no urinary problems we can recheck PSA in 4 weeks to see if any changes? Thoughts?

## 2024-10-05 NOTE — Telephone Encounter (Signed)
 Copied from CRM [id number]. Topic: Clinical - Lab/Test Results >> Oct 05, 2024  2:49 PM Sophia H wrote: Reason for CRM:   Returning missed call from Luke - Please return call when you get a chance. # [phone]

## 2024-10-05 NOTE — Telephone Encounter (Signed)
 Patient returned your call.

## 2024-10-21 LAB — COLOGUARD: COLOGUARD: NEGATIVE

## 2024-10-24 NOTE — Progress Notes (Signed)
 Cologuard is negative. This is great. Next screening in 3 years.

## 2024-12-30 ENCOUNTER — Ambulatory Visit: Admitting: Physician Assistant
